# Patient Record
Sex: Female | Born: 1995 | Hispanic: Yes | Marital: Single | Smoking: Never smoker
Health system: Southern US, Community
[De-identification: ages and names within clinical notes are randomized; demographics above are authoritative.]

## PROBLEM LIST (undated history)

## (undated) DIAGNOSIS — M329 Systemic lupus erythematosus, unspecified: Secondary | ICD-10-CM

## (undated) DIAGNOSIS — F909 Attention-deficit hyperactivity disorder, unspecified type: Secondary | ICD-10-CM

## (undated) DIAGNOSIS — M069 Rheumatoid arthritis, unspecified: Secondary | ICD-10-CM

## (undated) DIAGNOSIS — IMO0002 Reserved for concepts with insufficient information to code with codable children: Secondary | ICD-10-CM

## (undated) DIAGNOSIS — F4310 Post-traumatic stress disorder, unspecified: Secondary | ICD-10-CM

## (undated) DIAGNOSIS — R519 Headache, unspecified: Secondary | ICD-10-CM

## (undated) DIAGNOSIS — E079 Disorder of thyroid, unspecified: Secondary | ICD-10-CM

## (undated) DIAGNOSIS — F32A Depression, unspecified: Secondary | ICD-10-CM

## (undated) DIAGNOSIS — E282 Polycystic ovarian syndrome: Secondary | ICD-10-CM

## (undated) DIAGNOSIS — F419 Anxiety disorder, unspecified: Secondary | ICD-10-CM

## (undated) HISTORY — DX: Polycystic ovarian syndrome: E28.2

## (undated) HISTORY — DX: Headache, unspecified: R51.9

## (undated) HISTORY — DX: Systemic lupus erythematosus, unspecified: M32.9

---

## 2018-09-25 DIAGNOSIS — F329 Major depressive disorder, single episode, unspecified: Secondary | ICD-10-CM | POA: Insufficient documentation

## 2018-09-25 DIAGNOSIS — G43009 Migraine without aura, not intractable, without status migrainosus: Secondary | ICD-10-CM | POA: Insufficient documentation

## 2018-09-25 DIAGNOSIS — R419 Unspecified symptoms and signs involving cognitive functions and awareness: Secondary | ICD-10-CM | POA: Insufficient documentation

## 2019-03-19 DIAGNOSIS — E669 Obesity, unspecified: Secondary | ICD-10-CM | POA: Insufficient documentation

## 2019-03-19 DIAGNOSIS — E559 Vitamin D deficiency, unspecified: Secondary | ICD-10-CM | POA: Insufficient documentation

## 2020-05-14 ENCOUNTER — Other Ambulatory Visit: Payer: Self-pay

## 2020-05-14 ENCOUNTER — Emergency Department (HOSPITAL_BASED_OUTPATIENT_CLINIC_OR_DEPARTMENT_OTHER)
Admission: EM | Admit: 2020-05-14 | Discharge: 2020-05-14 | Disposition: A | Discharge status: Home / Self Care | Payer: 59 | Attending: Emergency Medicine | Admitting: Emergency Medicine

## 2020-05-14 ENCOUNTER — Emergency Department (HOSPITAL_BASED_OUTPATIENT_CLINIC_OR_DEPARTMENT_OTHER): Payer: 59

## 2020-05-14 ENCOUNTER — Encounter (HOSPITAL_BASED_OUTPATIENT_CLINIC_OR_DEPARTMENT_OTHER): Payer: Self-pay | Admitting: Emergency Medicine

## 2020-05-14 DIAGNOSIS — M25511 Pain in right shoulder: Secondary | ICD-10-CM | POA: Diagnosis present

## 2020-05-14 MED ORDER — METHOCARBAMOL 500 MG PO TABS
500.0000 mg | ORAL_TABLET | Freq: Two times a day (BID) | ORAL | 0 refills | Status: DC
Start: 2020-05-14 — End: 2020-08-10

## 2020-05-14 MED ORDER — IBUPROFEN 800 MG PO TABS
800.0000 mg | ORAL_TABLET | Freq: Three times a day (TID) | ORAL | 0 refills | Status: DC | PRN
Start: 2020-05-14 — End: 2020-08-10

## 2020-05-14 NOTE — ED Provider Notes (Signed)
[facility] EMERGENCY DEPARTMENT Provider Note   CSN: [id number] Arrival date & time: 05/14/20  4098     History Chief Complaint  Patient presents with  . Shoulder Pain    Maria Sutton is a 24 y.o. female.  She has no significant past medical history.  Right-hand-dominant.  Complaining of 1 week of right shoulder pain.  Does not recall any particular trauma.  Worse with lifting her arm up away from her body or any rotation.  No numbness or weakness.  No fevers or chills.  Trying ibuprofen daily 40 mg without improvement.  Scheduled an appointment with an orthopedic doctor tomorrow.  Pain woke her up this morning.  The history is provided by the patient.  Shoulder Pain Location:  Shoulder Shoulder location:  R shoulder Injury: no   Pain details:    Quality:  Sharp   Radiates to:  Does not radiate   Severity:  Severe   Onset quality:  Gradual   Duration:  1 week   Timing:  Intermittent   Progression:  Unchanged Handedness:  Right-handed Relieved by:  Nothing Worsened by:  Movement Ineffective treatments:  NSAIDs Associated symptoms: no back pain, no fever, no muscle weakness, no neck pain, no numbness and no tingling        History reviewed. No pertinent past medical history.  There are no problems to display for this patient.   History reviewed. No pertinent surgical history.   OB History   No obstetric history on file.     No family history on file.  Social History   Tobacco Use  . Smoking status: Never Smoker  . Smokeless tobacco: Never Used  Substance Use Topics  . Alcohol use: Not Currently  . Drug use: Never    Home Medications Prior to Admission medications   Not on File    Allergies    Patient has no allergy information on record.  Review of Systems   Review of Systems  Constitutional: Negative for fever.  Respiratory: Negative for shortness of breath.   Cardiovascular: Negative for chest pain.  Musculoskeletal: Negative for  back pain and neck pain.  Skin: Negative for rash.  Neurological: Negative for weakness and numbness.    Physical Exam Updated Vital Signs BP 119/78 (BP Location: Left Arm)   Pulse 91   Temp 98.5 F (36.9 C) (Oral)   Resp 16   Ht 5\' 7"  (1.702 m)   Wt 88.5 kg   SpO2 98%   BMI 30.54 kg/m   Physical Exam Constitutional:      Appearance: She is well-developed.  HENT:     Head: Normocephalic and atraumatic.  Eyes:     Conjunctiva/sclera: Conjunctivae normal.  Musculoskeletal:        General: Tenderness present. No deformity.     Cervical back: Neck supple.     Comments: Right shoulder painful active range of motion.  Normal internal and external rotation.  Normal landmarks.  Axillary sensation intact.  Pain with palpation anterior and lateral shoulder.  Full range of motion elbow wrist.  Radial pulse 2+.  Radial ulnar median motor and sensation intact.  Skin:    General: Skin is warm and dry.     Capillary Refill: Capillary refill takes less than 2 seconds.  Neurological:     General: No focal deficit present.     Mental Status: She is alert.     GCS: GCS eye subscore is 4. GCS verbal subscore is 5. GCS motor subscore  is 6.     Sensory: No sensory deficit.     Motor: No weakness.     ED Results / Procedures / Treatments   Labs (all labs ordered are listed, but only abnormal results are displayed) Labs Reviewed - No data to display  EKG None  Radiology DG Shoulder Right  Result Date: 05/14/2020 CLINICAL DATA:  Right shoulder pain for 1 week, no reported injury EXAM: RIGHT SHOULDER - 2+ VIEW COMPARISON:  None. FINDINGS: No fracture. No glenohumeral dislocation. No evidence of acromioclavicular separation. No focal osseous lesions. No arthropathy. No radiopaque foreign bodies or pathologic soft tissue calcifications. IMPRESSION: No acute osseous abnormality. Electronically Signed   By: Delbert Phenix M.D.   On: 05/14/2020 08:31    Procedures Procedures (including critical  care time)  Medications Ordered in ED Medications - No data to display  ED Course  I have reviewed the triage vital signs and the nursing notes.  Pertinent labs & imaging results that were available during my care of the patient were reviewed by me and considered in my medical decision making (see chart for details).  Clinical Course as of May 14 1844  Wynelle Link May 14, 2020  0805 Right shoulder interpreted by me as no fracture or dislocation.   [MB]    Clinical Course User Index [MB] Terrilee Files, MD   MDM Rules/Calculators/A&P                         24 year old healthy female here with atraumatic right shoulder pain.  Sounds like she does a fairly physical job.  She has a benign physical exam other than pain with range of motion.  X-rays do not show any fracture or dislocation.  Placed in a sling for comfort.  Will increase NSAIDs and add muscle relaxant.  She has outpatient orthopedic follow-up tomorrow.  Return instructions discussed  Final Clinical Impression(s) / ED Diagnoses Final diagnoses:  Acute pain of right shoulder    Rx / DC Orders ED Discharge Orders         Ordered    ibuprofen (ADVIL) 800 MG tablet  Every 8 hours PRN     Discontinue  Reprint     05/14/20 0807    methocarbamol (ROBAXIN) 500 MG tablet  2 times daily     Discontinue  Reprint     05/14/20 0807           Terrilee Files, MD 05/14/20 [id number]

## 2020-05-14 NOTE — ED Triage Notes (Signed)
Pt reports right sided shoulder pain starting a week ago; pt reports left sided shoulder pain the week prior; took 400mg  ibuprofen at 0445 with no relief; pt reports having an appointment with the orthopedist tomorrow but the pain woke her up

## 2020-05-14 NOTE — ED Notes (Signed)
Patient returned from X-ray 

## 2020-05-14 NOTE — ED Notes (Signed)
Patient transported to X-ray 

## 2020-05-14 NOTE — Discharge Instructions (Signed)
You were seen in the emergency department for right shoulder pain.  Your x-ray did not show any fracture or dislocation.  Please use the sling for comfort.  Ice to the affected area.  We are prescribing you ibuprofen and a muscle relaxant.  Please take with food on your stomach.  Follow-up with orthopedics as scheduled tomorrow.  Return to the emergency department for any worsening or concerning symptoms.

## 2020-06-25 ENCOUNTER — Encounter (HOSPITAL_BASED_OUTPATIENT_CLINIC_OR_DEPARTMENT_OTHER): Payer: Self-pay | Admitting: Emergency Medicine

## 2020-06-25 ENCOUNTER — Emergency Department (HOSPITAL_BASED_OUTPATIENT_CLINIC_OR_DEPARTMENT_OTHER)
Admission: EM | Admit: 2020-06-25 | Discharge: 2020-06-26 | Disposition: A | Discharge status: Home / Self Care | Payer: 59 | Attending: Emergency Medicine | Admitting: Emergency Medicine

## 2020-06-25 ENCOUNTER — Other Ambulatory Visit: Payer: Self-pay

## 2020-06-25 DIAGNOSIS — E039 Hypothyroidism, unspecified: Secondary | ICD-10-CM | POA: Insufficient documentation

## 2020-06-25 DIAGNOSIS — J020 Streptococcal pharyngitis: Secondary | ICD-10-CM | POA: Diagnosis not present

## 2020-06-25 DIAGNOSIS — Z20822 Contact with and (suspected) exposure to covid-19: Secondary | ICD-10-CM | POA: Diagnosis not present

## 2020-06-25 DIAGNOSIS — R6883 Chills (without fever): Secondary | ICD-10-CM | POA: Diagnosis present

## 2020-06-25 HISTORY — DX: Post-traumatic stress disorder, unspecified: F43.10

## 2020-06-25 HISTORY — DX: Anxiety disorder, unspecified: F41.9

## 2020-06-25 HISTORY — DX: Disorder of thyroid, unspecified: E07.9

## 2020-06-25 HISTORY — DX: Depression, unspecified: F32.A

## 2020-06-25 HISTORY — DX: Attention-deficit hyperactivity disorder, unspecified type: F90.9

## 2020-06-25 LAB — GROUP A STREP BY PCR: Group A Strep by PCR: DETECTED — AB

## 2020-06-25 LAB — SARS CORONAVIRUS 2 BY RT PCR (HOSPITAL ORDER, PERFORMED IN ~~LOC~~ HOSPITAL LAB): SARS Coronavirus 2: NEGATIVE

## 2020-06-25 MED ORDER — DEXAMETHASONE 6 MG PO TABS
10.0000 mg | ORAL_TABLET | Freq: Once | ORAL | Status: AC
  Administered 2020-06-25: 10 mg via ORAL
  Filled 2020-06-25: qty 1

## 2020-06-25 MED ORDER — AMOXICILLIN 500 MG PO CAPS
500.0000 mg | ORAL_CAPSULE | Freq: Once | ORAL | Status: AC
  Administered 2020-06-25: 500 mg via ORAL
  Filled 2020-06-25: qty 1

## 2020-06-25 NOTE — ED Triage Notes (Signed)
Reports starting her period friday, states nausea, headaches, dizzy, and sore throat since then. Requesting covid test.

## 2020-06-26 MED ORDER — AMOXICILLIN 500 MG PO CAPS
500.0000 mg | ORAL_CAPSULE | Freq: Two times a day (BID) | ORAL | 0 refills | Status: DC
Start: 2020-06-26 — End: 2020-08-10

## 2020-06-26 NOTE — Discharge Instructions (Addendum)
You were seen today and found to have strep throat.  Your Covid testing was negative.  Take amoxicillin for the next 10 days.  I encourage you to seek out Covid vaccination as well given the prevalence of Covid within the community.

## 2020-06-26 NOTE — ED Provider Notes (Signed)
[facility] EMERGENCY DEPARTMENT Provider Note   CSN: [id number] Arrival date & time: 06/25/20  1905     History Chief Complaint  Patient presents with  . wants covid test    Maria Sutton is a 24 y.o. female.  HPI     This is a 24 year old female with a history of ADHD, depression, hypothyroidism who presents with sore throat, chills, body aches.  Patient reports 2 to 3-day history of the symptoms.  No known sick contacts or Covid exposures.  She is not vaccinated against COVID-19.  She states that she feels like she has had a fever at home but has not taken her temperature.  She reports pain with swallowing.  Reports pain 5 out of 10.  She has not taken anything for her symptoms.  She is requesting a COVID-19 test.  Past Medical History:  Diagnosis Date  . ADHD   . Anxiety   . Depression   . PTSD (post-traumatic stress disorder)   . Thyroid disease    hypothyroid    There are no problems to display for this patient.   History reviewed. No pertinent surgical history.   OB History   No obstetric history on file.     History reviewed. No pertinent family history.  Social History   Tobacco Use  . Smoking status: Never Smoker  . Smokeless tobacco: Never Used  Substance Use Topics  . Alcohol use: Not Currently  . Drug use: Never    Home Medications Prior to Admission medications   Medication Sig Start Date End Date Taking? Authorizing Provider  amoxicillin (AMOXIL) 500 MG capsule Take 1 capsule (500 mg total) by mouth 2 (two) times daily. 06/26/20   Nanami Whitelaw, Mayer Masker, MD  ibuprofen (ADVIL) 200 MG tablet Take 200 mg by mouth every 6 (six) hours as needed.    [provider]  ibuprofen (ADVIL) 800 MG tablet Take 1 tablet (800 mg total) by mouth every 8 (eight) hours as needed. 05/14/20   Terrilee Files, MD  methocarbamol (ROBAXIN) 500 MG tablet Take 1 tablet (500 mg total) by mouth 2 (two) times daily. 05/14/20   Terrilee Files, MD     Allergies    Patient has no known allergies.  Review of Systems   Review of Systems  Constitutional: Positive for chills. Negative for fever.  HENT: Positive for sore throat. Negative for trouble swallowing.   Respiratory: Negative for cough and shortness of breath.   Cardiovascular: Negative for chest pain.  Gastrointestinal: Negative for abdominal pain, nausea and vomiting.  All other systems reviewed and are negative.   Physical Exam Updated Vital Signs BP 125/87 (BP Location: Right Arm)   Pulse 80   Temp 98.8 F (37.1 C)   Resp 18   Wt 91.8 kg   LMP 06/23/2020 (Exact Date)   SpO2 100%   BMI 31.68 kg/m   Physical Exam Vitals and nursing note reviewed.  Constitutional:      Appearance: She is well-developed. She is obese. She is not ill-appearing.  HENT:     Head: Normocephalic and atraumatic.     Nose: Nose normal.     Mouth/Throat:     Mouth: Mucous membranes are moist.     Comments: Posterior oropharyngeal erythema, no tonsillar exudate, there is slight 1+ tonsillar enlargement bilaterally, no palatal petechiae Eyes:     Pupils: Pupils are equal, round, and reactive to light.  Cardiovascular:     Rate and Rhythm: Normal rate and  regular rhythm.     Heart sounds: Normal heart sounds.  Pulmonary:     Effort: Pulmonary effort is normal. No respiratory distress.     Breath sounds: No wheezing.  Abdominal:     Palpations: Abdomen is soft.     Tenderness: There is no abdominal tenderness.  Musculoskeletal:     Cervical back: Neck supple.  Lymphadenopathy:     Cervical: Cervical adenopathy present.  Skin:    General: Skin is warm and dry.  Neurological:     Mental Status: She is alert and oriented to person, place, and time.  Psychiatric:        Mood and Affect: Mood normal.     ED Results / Procedures / Treatments   Labs (all labs ordered are listed, but only abnormal results are displayed) Labs Reviewed  GROUP A STREP BY PCR - Abnormal; Notable  for the following components:      Result Value   Group A Strep by PCR DETECTED (*)    All other components within normal limits  SARS CORONAVIRUS 2 BY RT PCR (HOSPITAL ORDER, PERFORMED IN [facility])    EKG None  Radiology No results found.  Procedures Procedures (including critical care time)  Medications Ordered in ED Medications  dexamethasone (DECADRON) tablet 10 mg (10 mg Oral Given 06/25/20 2353)  amoxicillin (AMOXIL) capsule 500 mg (500 mg Oral Given 06/25/20 2353)    ED Course  I have reviewed the triage vital signs and the nursing notes.  Pertinent labs & imaging results that were available during my care of the patient were reviewed by me and considered in my medical decision making (see chart for details).    MDM Rules/Calculators/A&P                           Patient presents with chills, sore throat, upper respiratory symptoms.  She is overall nontoxic and vital signs are reassuring.  She is afebrile.  She has some slight erythema of the posterior oropharynx but low suspicion for deep space infection such as PTA.  Covid and strep are both considerations.  Testing reviewed from triage.  Strep test is positive.  Covid test negative.  Discussed with patient these results.  We discussed treatment and she opted for 10 days of antibiotics at home.  She was also given 10 mg of Decadron.  I also encouraged her to get her COVID-19 vaccination.  After history, exam, and medical workup I feel the patient has been appropriately medically screened and is safe for discharge home. Pertinent diagnoses were discussed with the patient. Patient was given return precautions.  Maria Sutton was evaluated in Emergency Department on 06/26/2020 for the symptoms described in the history of present illness. She was evaluated in the context of the global COVID-19 pandemic, which necessitated consideration that the patient might be at risk for infection with the SARS-CoV-2 virus that  causes COVID-19. Institutional protocols and algorithms that pertain to the evaluation of patients at risk for COVID-19 are in a state of rapid change based on information released by regulatory bodies including the CDC and federal and state organizations. These policies and algorithms were followed during the patient's care in the ED.   Final Clinical Impression(s) / ED Diagnoses Final diagnoses:  Strep pharyngitis    Rx / DC Orders ED Discharge Orders         Ordered    amoxicillin (AMOXIL) 500 MG capsule  2 times daily        06/26/20 0001           Rosalena Mccorry, Mayer Masker, MD 06/26/20 0005

## 2020-07-26 ENCOUNTER — Emergency Department (HOSPITAL_BASED_OUTPATIENT_CLINIC_OR_DEPARTMENT_OTHER): Payer: 59

## 2020-07-26 ENCOUNTER — Other Ambulatory Visit: Payer: Self-pay

## 2020-07-26 ENCOUNTER — Encounter (HOSPITAL_BASED_OUTPATIENT_CLINIC_OR_DEPARTMENT_OTHER): Payer: Self-pay

## 2020-07-26 ENCOUNTER — Emergency Department (HOSPITAL_BASED_OUTPATIENT_CLINIC_OR_DEPARTMENT_OTHER)
Admission: EM | Admit: 2020-07-26 | Discharge: 2020-07-26 | Discharge status: Against Medical Advice | Payer: 59 | Attending: Emergency Medicine | Admitting: Emergency Medicine

## 2020-07-26 DIAGNOSIS — R0602 Shortness of breath: Secondary | ICD-10-CM | POA: Diagnosis not present

## 2020-07-26 DIAGNOSIS — R079 Chest pain, unspecified: Secondary | ICD-10-CM

## 2020-07-26 DIAGNOSIS — E039 Hypothyroidism, unspecified: Secondary | ICD-10-CM | POA: Diagnosis not present

## 2020-07-26 LAB — CBC
HCT: 40.9 % (ref 36.0–46.0)
Hemoglobin: 13.7 g/dL (ref 12.0–15.0)
MCH: 27.4 pg (ref 26.0–34.0)
MCHC: 33.5 g/dL (ref 30.0–36.0)
MCV: 81.8 fL (ref 80.0–100.0)
Platelets: 355 10*3/uL (ref 150–400)
RBC: 5 MIL/uL (ref 3.87–5.11)
RDW: 12 % (ref 11.5–15.5)
WBC: 10.8 10*3/uL — ABNORMAL HIGH (ref 4.0–10.5)
nRBC: 0 % (ref 0.0–0.2)

## 2020-07-26 LAB — BASIC METABOLIC PANEL
Anion gap: 12 (ref 5–15)
BUN: 11 mg/dL (ref 6–20)
CO2: 23 mmol/L (ref 22–32)
Calcium: 8.8 mg/dL — ABNORMAL LOW (ref 8.9–10.3)
Chloride: 102 mmol/L (ref 98–111)
Creatinine, Ser: 0.65 mg/dL (ref 0.44–1.00)
GFR, Estimated: >60 mL/min (ref >60)
Glucose, Bld: 108 mg/dL — ABNORMAL HIGH (ref 70–99)
Potassium: 3.6 mmol/L (ref 3.5–5.1)
Sodium: 137 mmol/L (ref 135–145)

## 2020-07-26 LAB — TROPONIN I (HIGH SENSITIVITY): Troponin I (High Sensitivity): <2 ng/L (ref <18)

## 2020-07-26 LAB — PREGNANCY, URINE: Preg Test, Ur: NEGATIVE

## 2020-07-26 LAB — D-DIMER, QUANTITATIVE: D-Dimer, Quant: 0.55 ug/mL-FEU — ABNORMAL HIGH (ref 0.00–0.50)

## 2020-07-26 MED ORDER — IOHEXOL 350 MG/ML SOLN: 100.0000 mL | Freq: Once | INTRAVENOUS | Status: DC | PRN

## 2020-07-26 NOTE — ED Triage Notes (Signed)
Pt c/o CP started 540pm-denies fever/flu sx-NAD-steady gait

## 2020-07-26 NOTE — ED Provider Notes (Signed)
[facility] EMERGENCY DEPARTMENT Provider Note   CSN: [id number] Arrival date & time: 07/26/20  1859     History Chief Complaint  Patient presents with  . Chest Pain    Maria Sutton is a 24 y.o. female.  HPI 24 year old female with a history of ADHD, anxiety, depression, PTSD, hypothyroidism presents to the ER with complaints of left-sided chest pain and pain on inspiration which started several hours ago.  Patient states that she has a history of tendinitis and has been taking some ibuprofen.  She states that she took some ibuprofen earlier today along with some vitamin D and then started to feel some left-sided chest pain.  She also has some associated shortness of breath and pain on inspiration.  Denies any nausea or vomiting.  No prior history of PEs.  No noticeable lower extremity swelling.  She is not on any oral estrogen.  No recent surgery or travel.  She also complains of intermittent joint pains including her knees, hands and elbows.  She states she followed up with an orthopedic doctor who took an x-ray of her right shoulder and said that she had tendinitis.  She does not have a PCP.  She denies any cough, loss of taste or smell, fevers or chills.  Denies any syncope, back pain, diaphoresis.    Past Medical History:  Diagnosis Date  . ADHD   . Anxiety   . Depression   . PTSD (post-traumatic stress disorder)   . Thyroid disease    hypothyroid    There are no problems to display for this patient.   History reviewed. No pertinent surgical history.   OB History   No obstetric history on file.     No family history on file.  Social History   Tobacco Use  . Smoking status: Never Smoker  . Smokeless tobacco: Never Used  Vaping Use  . Vaping Use: Never used  Substance Use Topics  . Alcohol use: Not Currently  . Drug use: Never    Home Medications Prior to Admission medications   Medication Sig Start Date End Date Taking? Authorizing Provider    amoxicillin (AMOXIL) 500 MG capsule Take 1 capsule (500 mg total) by mouth 2 (two) times daily. 06/26/20   Horton, Mayer Masker, MD  ibuprofen (ADVIL) 200 MG tablet Take 200 mg by mouth every 6 (six) hours as needed.    [provider]  ibuprofen (ADVIL) 800 MG tablet Take 1 tablet (800 mg total) by mouth every 8 (eight) hours as needed. 05/14/20   Terrilee Files, MD  methocarbamol (ROBAXIN) 500 MG tablet Take 1 tablet (500 mg total) by mouth 2 (two) times daily. 05/14/20   Terrilee Files, MD    Allergies    Other  Review of Systems   Review of Systems  Constitutional: Negative for chills and fever.  HENT: Negative for ear pain and sore throat.   Eyes: Negative for pain and visual disturbance.  Respiratory: Positive for shortness of breath. Negative for cough.   Cardiovascular: Positive for chest pain. Negative for palpitations.  Gastrointestinal: Negative for abdominal pain and vomiting.  Genitourinary: Negative for dysuria and hematuria.  Musculoskeletal: Negative for arthralgias and back pain.  Skin: Negative for color change and rash.  Neurological: Negative for seizures and syncope.  All other systems reviewed and are negative.   Physical Exam Updated Vital Signs BP 128/81 (BP Location: Right Arm)   Pulse 82   Temp 98.4 F (36.9 C) (  Oral)   Resp (!) 22   Wt 92.1 kg   LMP 06/21/2020 Comment: neg preg test  SpO2 100%   BMI 31.79 kg/m   Physical Exam Vitals and nursing note reviewed.  Constitutional:      General: She is not in acute distress.    Appearance: She is well-developed. She is obese. She is not ill-appearing or diaphoretic.  HENT:     Head: Normocephalic and atraumatic.  Eyes:     Conjunctiva/sclera: Conjunctivae normal.  Cardiovascular:     Rate and Rhythm: Normal rate and regular rhythm.     Heart sounds: Normal heart sounds. No murmur heard.   Pulmonary:     Effort: Pulmonary effort is normal. No respiratory distress.     Breath sounds:  Normal breath sounds. No decreased breath sounds, wheezing or rhonchi.  Chest:     Chest wall: No tenderness.  Abdominal:     Palpations: Abdomen is soft.     Tenderness: There is no abdominal tenderness.  Musculoskeletal:        General: Normal range of motion.     Cervical back: Neck supple.     Right lower leg: No tenderness. No edema.     Left lower leg: No tenderness. No edema.  Skin:    General: Skin is warm and dry.     Capillary Refill: Capillary refill takes less than 2 seconds.  Neurological:     General: No focal deficit present.     Mental Status: She is alert.  Psychiatric:        Mood and Affect: Mood normal.        Behavior: Behavior normal.      ED Results / Procedures / Treatments   Labs (all labs ordered are listed, but only abnormal results are displayed) Labs Reviewed  CBC - Abnormal; Notable for the following components:      Result Value   WBC 10.8 (*)    All other components within normal limits  BASIC METABOLIC PANEL - Abnormal; Notable for the following components:   Glucose, Bld 108 (*)    Calcium 8.8 (*)    All other components within normal limits  D-DIMER, QUANTITATIVE (NOT AT [facility]) - Abnormal; Notable for the following components:   D-Dimer, Quant 0.55 (*)    All other components within normal limits  PREGNANCY, URINE  TROPONIN I (HIGH SENSITIVITY)  TROPONIN I (HIGH SENSITIVITY)    EKG None  Radiology DG Chest 2 View  Result Date: 07/26/2020 CLINICAL DATA:  Chest pain this evening. EXAM: CHEST - 2 VIEW COMPARISON:  None. FINDINGS: The heart size and mediastinal contours are normal. The lungs are clear. There is no pleural effusion or pneumothorax. No acute osseous findings are identified. Minimal convex right thoracic scoliosis may be positional. IMPRESSION: No active cardiopulmonary process. Electronically Signed   By: Carey Bullocks M.D.   On: 07/26/2020 19:26    Procedures Procedures (including critical care time)  Medications  Ordered in ED Medications  iohexol (OMNIPAQUE) 350 MG/ML injection 100 mL (has no administration in time range)    ED Course  I have reviewed the triage vital signs and the nursing notes.  Pertinent labs & imaging results that were available during my care of the patient were reviewed by me and considered in my medical decision making (see chart for details).    MDM Rules/Calculators/A&P  24 year old female with complaints of left-sided chest pain and pain on inspiration which started several hours ago.  Her vitals overall reassuring, though she is mildly tachypneic with a rate in the 20s.  No reproducible chest wall tenderness.  Abdomen is soft and nontender.  DDx includes ACS, PE, pill esophagitis, GERD.  Her CBC has a leukocytosis of 10.8, BMP with mild hypocalcemia but no significant abnormalities.  Her troponin is less than 2, however D-dimer is at 0.55.  EKG normal sinus rhythm.  Chest x-ray without acute abnormalities.  Pregnancy is negative.  Patient was a difficult stick, multiple ultrasound-guided IVs were attempted.  Patient states that she does not want to have the CT scan and wants to sign out AGAINST MEDICAL ADVICE.  She states that her chest pain and pain on inspiration has improved.  I explained to the patient the risks of this and foregoing CT scan in the setting of an elevated D-dimer.  I explained that complications could involve severe disability and death.  I welcomed the patient to return at any time for further evaluation.  She states she would like to follow-up with the PCP, I directed her to the phone number in her discharge paperwork.  She voiced understanding and is agreeable.  Hemodynamically stable at discharge.   Final Clinical Impression(s) / ED Diagnoses Final diagnoses:  Chest pain, unspecified type    Rx / DC Orders ED Discharge Orders    None       Mare Ferrari, PA-C 07/26/20 2318    Sabas Sous, MD 08/04/20 1544

## 2020-07-26 NOTE — Progress Notes (Signed)
Patient unwilling to allow IV options at this time.  Patient wants to explore other options.

## 2020-07-26 NOTE — ED Notes (Signed)
Patient requesting she followup with PCP; patient states she just doesn't want another needle stick today.

## 2020-07-26 NOTE — Discharge Instructions (Signed)
As discussed, you are leaving AGAINST MEDICAL ADVICE by not moving forward with the CT scan.  Risks of this include severe disability and death.  Please make sure to follow-up a primary care doctor, there is a phone number in your discharge paperwork which you can call in order to establish with one.  Please feel free to return to the ER at any time for further evaluation and work-up.

## 2020-08-10 ENCOUNTER — Other Ambulatory Visit: Payer: Self-pay

## 2020-08-10 ENCOUNTER — Ambulatory Visit (INDEPENDENT_AMBULATORY_CARE_PROVIDER_SITE_OTHER): Payer: 59 | Admitting: Family Medicine

## 2020-08-10 ENCOUNTER — Encounter: Payer: Self-pay | Admitting: Family Medicine

## 2020-08-10 VITALS — BP 125/84 | HR 77 | Temp 97.3°F | Ht 67.0 in | Wt 204.0 lb

## 2020-08-10 DIAGNOSIS — Z13220 Encounter for screening for lipoid disorders: Secondary | ICD-10-CM

## 2020-08-10 DIAGNOSIS — Z131 Encounter for screening for diabetes mellitus: Secondary | ICD-10-CM

## 2020-08-10 DIAGNOSIS — Z1159 Encounter for screening for other viral diseases: Secondary | ICD-10-CM

## 2020-08-10 DIAGNOSIS — E559 Vitamin D deficiency, unspecified: Secondary | ICD-10-CM

## 2020-08-10 DIAGNOSIS — Z0000 Encounter for general adult medical examination without abnormal findings: Secondary | ICD-10-CM

## 2020-08-10 DIAGNOSIS — Z6831 Body mass index (BMI) 31.0-31.9, adult: Secondary | ICD-10-CM

## 2020-08-10 DIAGNOSIS — Z114 Encounter for screening for human immunodeficiency virus [HIV]: Secondary | ICD-10-CM

## 2020-08-10 DIAGNOSIS — G43009 Migraine without aura, not intractable, without status migrainosus: Secondary | ICD-10-CM

## 2020-08-10 DIAGNOSIS — Z1329 Encounter for screening for other suspected endocrine disorder: Secondary | ICD-10-CM

## 2020-08-10 DIAGNOSIS — Z0001 Encounter for general adult medical examination with abnormal findings: Secondary | ICD-10-CM | POA: Diagnosis not present

## 2020-08-10 DIAGNOSIS — M25532 Pain in left wrist: Secondary | ICD-10-CM

## 2020-08-10 DIAGNOSIS — M25531 Pain in right wrist: Secondary | ICD-10-CM

## 2020-08-10 NOTE — Progress Notes (Signed)
11/4/202112:58 PM  Maria Sutton 07-Aug-1996, 24 y.o., female [id number]  Chief Complaint  Patient presents with  . thyroid check    and vitamin D last check 2 yrs ago - at [facility]  . joint pain and swelling    travels to feet, knees, hip, jaw  . Headache    takes cbd / topomax caused reaction    HPI:   Patient is a 24 y.o. female with past medical history significant for migraines who presents today for annual physical   Was previously in school Had issues with anxiety and migraines Issues with joints  Recent ED visits: 07/26/2020:24 year old female with complaints of left-sided chest pain and pain on inspiration which started several hours ago.  Her vitals overall reassuring, though she is mildly tachypneic with a rate in the 20s.  No reproducible chest wall tenderness.  Abdomen is soft and nontender.  DDx includes ACS, PE, pill esophagitis, GERD.  Her CBC has a leukocytosis of 10.8, BMP with mild hypocalcemia but no significant abnormalities.  Her troponin is less than 2, however D-dimer is at 0.55.  EKG normal sinus rhythm.  Chest x-ray without acute abnormalities.  Pregnancy is negative.  Patient was a difficult stick, multiple ultrasound-guided IVs were attempted.  Patient states that she does not want to have the CT scan and wants to sign out AGAINST MEDICAL ADVICE.  She states that her chest pain and pain on inspiration has improved.  I explained to the patient the risks of this and foregoing CT scan in the setting of an elevated D-dimer.  I explained that complications could involve severe disability and death.  I welcomed the patient to return at any time for further evaluation.  She states she would like to follow-up with the PCP, I directed her to the phone number in her discharge paperwork.  She voiced understanding and is agreeable.  Hemodynamically stable at discharge.  06/25/20: Patient presents with chills, sore throat, upper respiratory symptoms.  She is  overall nontoxic and vital signs are reassuring.  She is afebrile.  She has some slight erythema of the posterior oropharynx but low suspicion for deep space infection such as PTA.  Covid and strep are both considerations.  Testing reviewed from triage.  Strep test is positive.  Covid test negative.  Discussed with patient these results.  We discussed treatment and she opted for 10 days of antibiotics at home.  She was also given 10 mg of Decadron.  I also encouraged her to get her COVID-19 vaccination.  05/14/20: 24 year old healthy female here with atraumatic right shoulder pain.  Sounds like she does a fairly physical job.  She has a benign physical exam other than pain with range of motion.  X-rays do not show any fracture or dislocation.  Placed in a sling for comfort.  Will increase NSAIDs and add muscle relaxant.  She has outpatient orthopedic follow-up tomorrow.  Return instructions discussed   Migraine : she reports worsening migraine, has tried fiorocet in the past but no relief.we will start Imitrex as needed. SUMAtriptan (IMITREX) 25 MG tablet 30 tablet 0 : no longer taking this Sig: Take 1 tablet (25 mg total) by mouth every 2 (two) hours as needed for Migraine. Maximum 200 mg/day  Tried topamax but had an adverse reaction. Currently taking CBD for migraines, this is working well for her. Max is 200mg .  MRI was ordered by neurology. Insurance wouldn't pay for MRI.  Situational Anxiety Uses CBD for this. Used  to take daily now takes once per week.  Joint Pain Hands 2017, hard to write, cramps up for the rest of the day When driving cramping of forearm Tendonitis of right shoulder Ortho: did Xray Aug said she did not have arthritis Ibuprofen and muscle relaxer This worked well, and without movement improved At one point this flared up on her jaw Has noticed swelling in joints as well Unsure of family history of autoimmune diseases  She has been told previously she has  Depression,PTSD, ADHD, and triggered by needles   Needs; Pap: needs to schedule,  Dads side family history of breast cancer in 50s-60s. Covid: Declines Flu: Declines  Dentist: hasn't been Eye doctor: hasn't beeen  LMP: Oct 23rd Sexually active: yes, not trying to get pregnant Contraception: condoms Regular 30 days can fluctuate up to a week Heavy bleeding for 7 days  Depression screen [facility] 2/9 08/10/2020  Decreased Interest 0  Down, Depressed, Hopeless 0  PHQ - 2 Score 0    Fall Risk  08/10/2020  Falls in the past year? 0  Number falls in past yr: 0  Injury with Fall? 0  Follow up Falls evaluation completed     Allergies  Allergen Reactions  . Other     "alcohol"    Prior to Admission medications   Medication Sig Start Date End Date Taking? Authorizing Provider  amoxicillin (AMOXIL) 500 MG capsule Take 1 capsule (500 mg total) by mouth 2 (two) times daily. 06/26/20   Horton, Mayer Masker, MD  ibuprofen (ADVIL) 200 MG tablet Take 200 mg by mouth every 6 (six) hours as needed.    [provider]  ibuprofen (ADVIL) 800 MG tablet Take 1 tablet (800 mg total) by mouth every 8 (eight) hours as needed. 05/14/20   Terrilee Files, MD  methocarbamol (ROBAXIN) 500 MG tablet Take 1 tablet (500 mg total) by mouth 2 (two) times daily. 05/14/20   Terrilee Files, MD    Past Medical History:  Diagnosis Date  . ADHD   . Anxiety   . Depression   . Depression    [employer]reesia 08/07/2020  . Headache   . PTSD (post-traumatic stress disorder)   . Thyroid disease    hypothyroid    No past surgical history on file.  Social History   Tobacco Use  . Smoking status: Never Smoker  . Smokeless tobacco: Never Used  Substance Use Topics  . Alcohol use: Not Currently    No family history on file.  Review of Systems  Constitutional: Negative for chills, fever and malaise/fatigue.  HENT: Negative for ear pain and tinnitus.   Eyes: Negative for blurred vision and double vision.    Respiratory: Negative for cough, shortness of breath and wheezing.   Cardiovascular: Negative for chest pain, palpitations and leg swelling.  Gastrointestinal: Negative for abdominal pain, blood in stool, constipation, diarrhea, heartburn, nausea and vomiting.  Genitourinary: Negative for dysuria, frequency and hematuria.  Musculoskeletal: Positive for joint pain. Negative for back pain.  Skin: Negative for rash.  Neurological: Positive for headaches. Negative for dizziness, tingling, sensory change, focal weakness and weakness.  Psychiatric/Behavioral: Positive for depression. The patient is nervous/anxious.      OBJECTIVE:  Today's Vitals   08/10/20 1117  BP: 125/84  Pulse: 77  Temp: (!) 97.3 F (36.3 C)  SpO2: 99%  Weight: 204 lb (92.5 kg)  Height: 5\' 7"  (1.702 m)   Body mass index is 31.95 kg/m.   Physical Exam Vitals reviewed.  Constitutional:      General: She is not in acute distress.    Appearance: Normal appearance. She is not ill-appearing.  HENT:     Head: Normocephalic.     Right Ear: Tympanic membrane and ear canal normal.     Left Ear: Tympanic membrane and ear canal normal.     Nose: Nose normal.     Mouth/Throat:     Mouth: Mucous membranes are moist.     Pharynx: No oropharyngeal exudate or posterior oropharyngeal erythema.  Eyes:     Extraocular Movements: Extraocular movements intact.     Pupils: Pupils are equal, round, and reactive to light.  Cardiovascular:     Rate and Rhythm: Normal rate and regular rhythm.     Pulses: Normal pulses.     Heart sounds: Normal heart sounds. No murmur heard.  No friction rub. No gallop.   Pulmonary:     Effort: Pulmonary effort is normal. No respiratory distress.     Breath sounds: Normal breath sounds. No stridor. No wheezing, rhonchi or rales.  Abdominal:     General: Bowel sounds are normal.     Palpations: Abdomen is soft.     Tenderness: There is no abdominal tenderness.  Musculoskeletal:         General: No swelling, tenderness, deformity or signs of injury. Normal range of motion.     Cervical back: Normal range of motion.     Right lower leg: No edema.     Left lower leg: No edema.  Skin:    General: Skin is warm and dry.     Capillary Refill: Capillary refill takes less than 2 seconds.  Neurological:     General: No focal deficit present.     Mental Status: She is alert and oriented to person, place, and time.  Psychiatric:        Mood and Affect: Mood normal.        Behavior: Behavior normal.     No results found for this or any previous visit (from the past 24 hour(s)).  No results found.  ASSESSMENT and PLAN  Problem List Items Addressed This Visit   Visit Diagnoses    Migraine without aura and without status migrainosus, not intractable    -  Primary   Relevant Orders   Ambulatory referral to Neurology She is currently using CBD for this and is happy with current regimen.   Vitamin D deficiency       Relevant Orders   Vitamin D, 25-hydroxy   Lipid screening       Relevant Orders   Lipid Panel   Diabetes mellitus screening       Relevant Orders   Hemoglobin A1c   Hypocalcemia       Relevant Orders   Comprehensive metabolic panel   Screening for thyroid disorder       Relevant Orders   TSH   Annual physical exam     Will follow up with lab results Discussed the importance of eye doctor yearly and dentist q6 months. Discussed contraception options, waiting until next appointment after she has seen neurology to start this. Discussed the need for a pap, she will schedule this.    Encounter for hepatitis C screening test for low risk patient       Relevant Orders   Hepatitis C antibody   Encounter for screening for HIV       Relevant Orders   HIV Antibody (routine testing w rflx)  Body mass index (BMI) 31.0-31.9, adult       Pain of both wrist joints       Relevant Orders   ANA     Return in about 1 year (around 08/10/2021).  Macario Carls Nicolas Banh,  FNP-BC [facility] [address] [place], [place] [address] Ph.  [phone] Fax [phone]

## 2020-08-10 NOTE — Patient Instructions (Addendum)
Would recommend to schedule.  Health Maintenance, Female Adopting a healthy lifestyle and getting preventive care are important in promoting health and wellness. Ask your health care provider about:  The right schedule for you to have regular tests and exams.  Things you can do on your own to prevent diseases and keep yourself healthy. What should I know about diet, weight, and exercise? Eat a healthy diet   Eat a diet that includes plenty of vegetables, fruits, low-fat dairy products, and lean protein.  Do not eat a lot of foods that are high in solid fats, added sugars, or sodium. Maintain a healthy weight Body mass index (BMI) is used to identify weight problems. It estimates body fat based on height and weight. Your health care provider can help determine your BMI and help you achieve or maintain a healthy weight. Get regular exercise Get regular exercise. This is one of the most important things you can do for your health. Most adults should:  Exercise for at least 150 minutes each week. The exercise should increase your heart rate and make you sweat (moderate-intensity exercise).  Do strengthening exercises at least twice a week. This is in addition to the moderate-intensity exercise.  Spend less time sitting. Even light physical activity can be beneficial. Watch cholesterol and blood lipids Have your blood tested for lipids and cholesterol at 23 years of age, then have this test every 5 years. Have your cholesterol levels checked more often if:  Your lipid or cholesterol levels are high.  You are older than 23 years of age.  You are at high risk for heart disease. What should I know about cancer screening? Depending on your health history and family history, you may need to have cancer screening at various ages. This may include screening for:  Breast cancer.  Cervical cancer.  Colorectal cancer.  Skin cancer.  Lung cancer. What should I know about heart  disease, diabetes, and high blood pressure? Blood pressure and heart disease  High blood pressure causes heart disease and increases the risk of stroke. This is more likely to develop in people who have high blood pressure readings, are of African descent, or are overweight.  Have your blood pressure checked: ? Every 3-5 years if you are 65-64 years of age. ? Every year if you are 37 years old or older. Diabetes Have regular diabetes screenings. This checks your fasting blood sugar level. Have the screening done:  Once every three years after age 68 if you are at a normal weight and have a low risk for diabetes.  More often and at a younger age if you are overweight or have a high risk for diabetes. What should I know about preventing infection? Hepatitis B If you have a higher risk for hepatitis B, you should be screened for this virus. Talk with your health care provider to find out if you are at risk for hepatitis B infection. Hepatitis C Testing is recommended for:  Everyone born from [birth year] through 1964.  Anyone with known risk factors for hepatitis C. Sexually transmitted infections (STIs)  Get screened for STIs, including gonorrhea and chlamydia, if: ? You are sexually active and are younger than 23 years of age. ? You are older than 23 years of age and your health care provider tells you that you are at risk for this type of infection. ? Your sexual activity has changed since you were last screened, and you are at increased risk for chlamydia or gonorrhea.  Ask your health care provider if you are at risk.  Ask your health care provider about whether you are at high risk for HIV. Your health care provider may recommend a prescription medicine to help prevent HIV infection. If you choose to take medicine to prevent HIV, you should first get tested for HIV. You should then be tested every 3 months for as long as you are taking the medicine. Pregnancy  If you are about to stop  having your period (premenopausal) and you may become pregnant, seek counseling before you get pregnant.  Take 400 to 800 micrograms (mcg) of folic acid every day if you become pregnant.  Ask for birth control (contraception) if you want to prevent pregnancy. Osteoporosis and menopause Osteoporosis is a disease in which the bones lose minerals and strength with aging. This can result in bone fractures. If you are 10565 years old or older, or if you are at risk for osteoporosis and fractures, ask your health care provider if you should:  Be screened for bone loss.  Take a calcium or vitamin D supplement to lower your risk of fractures.  Be given hormone replacement therapy (HRT) to treat symptoms of menopause. Follow these instructions at home: Lifestyle  Do not use any products that contain nicotine or tobacco, such as cigarettes, e-cigarettes, and chewing tobacco. If you need help quitting, ask your health care provider.  Do not use street drugs.  Do not share needles.  Ask your health care provider for help if you need support or information about quitting drugs. Alcohol use  Do not drink alcohol if: ? Your health care provider tells you not to drink. ? You are pregnant, may be pregnant, or are planning to become pregnant.  If you drink alcohol: ? Limit how much you use to 0-1 drink a day. ? Limit intake if you are breastfeeding.  Be aware of how much alcohol is in your drink. In the [place] one drink equals one 12 oz bottle of beer (355 mL), one 5 oz glass of wine (148 mL), or one 1 oz glass of hard liquor (44 mL). General instructions  Schedule regular health, dental, and eye exams.  Stay current with your vaccines.  Tell your health care provider if: ? You often feel depressed. ? You have ever been abused or do not feel safe at home. Summary  Adopting a healthy lifestyle and getting preventive care are important in promoting health and wellness.  Follow your health  care provider's instructions about healthy diet, exercising, and getting tested or screened for diseases.  Follow your health care provider's instructions on monitoring your cholesterol and blood pressure. This information is not intended to replace advice given to you by your health care provider. Make sure you discuss any questions you have with your health care provider. Document Revised: 09/16/2018 Document Reviewed: 09/16/2018 Elsevier Patient Education  2020 [employer]  Pap Test Why am I having this test? A Pap test, also called a Pap smear, is a screening test to check for signs of:  Cancer of the vagina, cervix, and uterus. The cervix is the lower part of the uterus that opens into the vagina.  Infection.  Changes that may be a sign that cancer is developing (precancerous changes). Women need this test on a regular basis. In general, you should have a Pap test every 3 years until you reach menopause or age 23. Women aged 30-60 may choose to have their Pap test done at the same  time as an HPV (human papillomavirus) test every 5 years (instead of every 3 years). Your health care provider may recommend having Pap tests more or less often depending on your medical conditions and past Pap test results. What kind of sample is taken?  Your health care provider will collect a sample of cells from the surface of your cervix. This will be done using a small cotton swab, plastic spatula, or brush. This sample is often collected during a pelvic exam, when you are lying on your back on an exam table with feet in footrests (stirrups). In some cases, fluids (secretions) from the cervix or vagina may also be collected. How do I prepare for this test?  Be aware of where you are in your menstrual cycle. If you are menstruating on the day of the test, you may be asked to reschedule.  You may need to reschedule if you have a known vaginal infection on the day of the test.  Follow instructions  from your health care provider about: ? Changing or stopping your regular medicines. Some medicines can cause abnormal test results, such as digitalis and tetracycline. ? Avoiding douching or taking a bath the day before or the day of the test. Tell a health care provider about:  Any allergies you have.  All medicines you are taking, including vitamins, herbs, eye drops, creams, and over-the-counter medicines.  Any blood disorders you have.  Any surgeries you have had.  Any medical conditions you have.  Whether you are pregnant or may be pregnant. How are the results reported? Your test results will be reported as either abnormal or normal. A false-positive result can occur. A false positive is incorrect because it means that a condition is present when it is not. A false-negative result can occur. A false negative is incorrect because it means that a condition is not present when it is. What do the results mean? A normal test result means that you do not have signs of cancer of the vagina, cervix, or uterus. An abnormal result may mean that you have:  Cancer. A Pap test by itself is not enough to diagnose cancer. You will have more tests done in this case.  Precancerous changes in your vagina, cervix, or uterus.  Inflammation of the cervix.  An STD (sexually transmitted disease).  A fungal infection.  A parasite infection. Talk with your health care provider about what your results mean. Questions to ask your health care provider Ask your health care provider, or the department that is doing the test:  When will my results be ready?  How will I get my results?  What are my treatment options?  What other tests do I need?  What are my next steps? Summary  In general, women should have a Pap test every 3 years until they reach menopause or age 67.  Your health care provider will collect a sample of cells from the surface of your cervix. This will be done using a  small cotton swab, plastic spatula, or brush.  In some cases, fluids (secretions) from the cervix or vagina may also be collected. This information is not intended to replace advice given to you by your health care provider. Make sure you discuss any questions you have with your health care provider. Document Revised: 06/02/2017 Document Reviewed: 06/02/2017 Elsevier Patient Education  [employer].   If you have lab work done today you will be contacted with your lab results within the next 2 weeks.  If you have not heard from [place] then please contact us. The fastest way to get your results is to register for My Chart.   IF you received an x-ray today, you will receive an invoice from [facility]. Please contact [facility] at [phone] with questions or concerns regarding your invoice.   IF you received labwork today, you will receive an invoice from [place]. Please contact [facility] at [phone] with questions or concerns regarding your invoice.   Our billing staff will not be able to assist you with questions regarding bills from these companies.  You will be contacted with the lab results as soon as they are available. The fastest way to get your results is to activate your My Chart account. Instructions are located on the last page of this paperwork. If you have not heard from [place] regarding the results in 2 weeks, please contact this office.

## 2020-08-11 ENCOUNTER — Other Ambulatory Visit: Payer: Self-pay | Admitting: Family Medicine

## 2020-08-11 ENCOUNTER — Telehealth: Payer: Self-pay | Admitting: Family Medicine

## 2020-08-11 DIAGNOSIS — R768 Other specified abnormal immunological findings in serum: Secondary | ICD-10-CM

## 2020-08-11 LAB — COMPREHENSIVE METABOLIC PANEL
ALT: 11 IU/L (ref 0–32)
AST: 16 IU/L (ref 0–40)
Albumin/Globulin Ratio: 1.9 (ref 1.2–2.2)
Albumin: 5 g/dL (ref 3.9–5.0)
Alkaline Phosphatase: 74 IU/L (ref 44–121)
BUN/Creatinine Ratio: 8 — ABNORMAL LOW (ref 9–23)
BUN: 6 mg/dL (ref 6–20)
Bilirubin Total: 0.6 mg/dL (ref 0.0–1.2)
CO2: 23 mmol/L (ref 20–29)
Calcium: 9.8 mg/dL (ref 8.7–10.2)
Chloride: 102 mmol/L (ref 96–106)
Creatinine, Ser: 0.72 mg/dL (ref 0.57–1.00)
GFR calc Af Amer: 137 mL/min/{1.73_m2} (ref >59)
GFR calc non Af Amer: 118 mL/min/{1.73_m2} (ref >59)
Globulin, Total: 2.7 g/dL (ref 1.5–4.5)
Glucose: 81 mg/dL (ref 65–99)
Potassium: 4.3 mmol/L (ref 3.5–5.2)
Sodium: 140 mmol/L (ref 134–144)
Total Protein: 7.7 g/dL (ref 6.0–8.5)

## 2020-08-11 LAB — HEPATITIS C ANTIBODY: Hep C Virus Ab: <0.1 s/co ratio (ref 0.0–0.9)

## 2020-08-11 LAB — VITAMIN D 25 HYDROXY (VIT D DEFICIENCY, FRACTURES): Vit D, 25-Hydroxy: 26.7 ng/mL — ABNORMAL LOW (ref 30.0–100.0)

## 2020-08-11 LAB — LIPID PANEL
Chol/HDL Ratio: 4.2 ratio (ref 0.0–4.4)
Cholesterol, Total: 202 mg/dL — ABNORMAL HIGH (ref 100–199)
HDL: 48 mg/dL (ref >39)
LDL Chol Calc (NIH): 137 mg/dL — ABNORMAL HIGH (ref 0–99)
Triglycerides: 92 mg/dL (ref 0–149)
VLDL Cholesterol Cal: 17 mg/dL (ref 5–40)

## 2020-08-11 LAB — HIV ANTIBODY (ROUTINE TESTING W REFLEX): HIV Screen 4th Generation wRfx: NONREACTIVE

## 2020-08-11 LAB — HEMOGLOBIN A1C
Est. average glucose Bld gHb Est-mCnc: 103 mg/dL
Hgb A1c MFr Bld: 5.2 % (ref 4.8–5.6)

## 2020-08-11 LAB — TSH: TSH: 0.779 u[IU]/mL (ref 0.450–4.500)

## 2020-08-11 LAB — ANA: Anti Nuclear Antibody (ANA): POSITIVE — AB

## 2020-08-11 NOTE — Telephone Encounter (Signed)
What is the name of the medication? Pt is wanting plan  B  Have you contacted your pharmacy to request a refill? Yes, she was told to contact her pcp-the pharmacy is needing a script for plan B.  Which pharmacy would you like this sent to? [facility] on [address] in [place].    Patient notified that their request is being sent to the clinical staff for review and that they should receive a call once it is complete. If they do not receive a call within 72 hours they can check with their pharmacy or our office.

## 2020-08-11 NOTE — Telephone Encounter (Signed)
Plan B cannot be prescribed this an OTC med tried to call and inform pt no answer

## 2020-08-11 NOTE — Progress Notes (Signed)
Hello, If you could let Maria Sutton know her ANA titer which we did to screen her for rheumatoid arthritis did come back positive. This does not mean she has it, but that we need to do further tests in order to determine. I have placed a referral for her for Rheumatology and they should be contacting her. Thanks!

## 2020-08-14 ENCOUNTER — Encounter: Payer: Self-pay | Admitting: Family Medicine

## 2020-08-15 ENCOUNTER — Ambulatory Visit (INDEPENDENT_AMBULATORY_CARE_PROVIDER_SITE_OTHER): Payer: 59 | Admitting: Family Medicine

## 2020-08-15 ENCOUNTER — Encounter: Payer: Self-pay | Admitting: Family Medicine

## 2020-08-15 ENCOUNTER — Other Ambulatory Visit: Payer: Self-pay

## 2020-08-15 ENCOUNTER — Other Ambulatory Visit (HOSPITAL_COMMUNITY)
Admission: RE | Admit: 2020-08-15 | Discharge: 2020-08-15 | Disposition: A | Discharge status: Home / Self Care | Payer: 59 | Source: Ambulatory Visit | Attending: Family Medicine | Admitting: Family Medicine

## 2020-08-15 VITALS — BP 122/83 | HR 80 | Temp 98.0°F | Ht 67.0 in | Wt 202.0 lb

## 2020-08-15 DIAGNOSIS — Z124 Encounter for screening for malignant neoplasm of cervix: Secondary | ICD-10-CM | POA: Insufficient documentation

## 2020-08-15 DIAGNOSIS — Z113 Encounter for screening for infections with a predominantly sexual mode of transmission: Secondary | ICD-10-CM | POA: Diagnosis not present

## 2020-08-15 DIAGNOSIS — Z114 Encounter for screening for human immunodeficiency virus [HIV]: Secondary | ICD-10-CM | POA: Diagnosis not present

## 2020-08-15 NOTE — Progress Notes (Signed)
11/9/202111:39 AM  Maria Sutton 08-Feb-1996, 24 y.o., female [id number]  Chief Complaint  Patient presents with  . Gynecologic Exam    STD check, request pregnancy testing     HPI:   Patient is a 24 y.o. female with past medical history significant for migraines who presents today for annual pap.  She was having intercourse on Friday the 5th and the condom came off She took plan B on the morning of the 6th  Thyroid Discussed results Lab Results  Component Value Date   TSH 0.779 08/10/2020     Positive ANA Discussed results Lab Results  Component Value Date   ANA Positive (A) 08/10/2020  Referral placed to rheumatology Will have an appointment on Monday  Needs; Pap: today Dads side family history of breast cancer in 50s-60s. Covid: Declines Flu: Declines  Dentist: hasn't been Eye doctor: hasn't beeen  LMP: Oct 23rd Sexually active: yes, not trying to get pregnant Contraception: condoms Regular 30 days can fluctuate up to a week Heavy bleeding for 7 days  Depression screen [facility] 2/9 08/15/2020 08/10/2020  Decreased Interest 0 0  Down, Depressed, Hopeless 0 0  PHQ - 2 Score 0 0    Fall Risk  08/15/2020 08/10/2020  Falls in the past year? 0 0  Number falls in past yr: 0 0  Injury with Fall? 0 0  Follow up Falls evaluation completed Falls evaluation completed     Allergies  Allergen Reactions  . Other     "alcohol"    Prior to Admission medications   Medication Sig Start Date End Date Taking? Authorizing Provider  amoxicillin (AMOXIL) 500 MG capsule Take 1 capsule (500 mg total) by mouth 2 (two) times daily. 06/26/20   Horton, Mayer Masker, MD  ibuprofen (ADVIL) 200 MG tablet Take 200 mg by mouth every 6 (six) hours as needed.    [provider]  ibuprofen (ADVIL) 800 MG tablet Take 1 tablet (800 mg total) by mouth every 8 (eight) hours as needed. 05/14/20   Terrilee Files, MD  methocarbamol (ROBAXIN) 500 MG tablet Take 1 tablet (500 mg total)  by mouth 2 (two) times daily. 05/14/20   Terrilee Files, MD    Past Medical History:  Diagnosis Date  . ADHD   . Anxiety   . Depression   . Depression    [employer] 08/07/2020  . Headache   . PTSD (post-traumatic stress disorder)   . Thyroid disease    hypothyroid    No past surgical history on file.  Social History   Tobacco Use  . Smoking status: Never Smoker  . Smokeless tobacco: Never Used  Substance Use Topics  . Alcohol use: Not Currently    No family history on file.  Review of Systems  Constitutional: Negative for chills, fever and malaise/fatigue.  HENT: Negative for ear pain and tinnitus.   Eyes: Negative for blurred vision and double vision.  Respiratory: Negative for cough, shortness of breath and wheezing.   Cardiovascular: Negative for chest pain, palpitations and leg swelling.  Gastrointestinal: Negative for abdominal pain, blood in stool, constipation, diarrhea, heartburn, nausea and vomiting.  Genitourinary: Negative for dysuria, frequency and hematuria.  Musculoskeletal: Positive for joint pain. Negative for back pain.  Skin: Negative for rash.  Neurological: Positive for headaches. Negative for dizziness, tingling, sensory change, focal weakness and weakness.  Psychiatric/Behavioral: Positive for depression. The patient is nervous/anxious.      OBJECTIVE:  Today's Vitals   08/15/20 1103  BP: 122/83  Pulse: 80  Temp: 98 F (36.7 C)  SpO2: 99%  Weight: 202 lb (91.6 kg)  Height: 5\' 7"  (1.702 m)   Body mass index is 31.64 kg/m.   Physical Exam Vitals reviewed. Exam conducted with a chaperone present.  Constitutional:      General: She is not in acute distress.    Appearance: Normal appearance. She is not ill-appearing.  Cardiovascular:     Rate and Rhythm: Normal rate and regular rhythm.     Pulses: Normal pulses.     Heart sounds: Normal heart sounds. No murmur heard.  No friction rub. No gallop.   Pulmonary:     Effort: Pulmonary  effort is normal. No respiratory distress.     Breath sounds: Normal breath sounds. No stridor. No wheezing, rhonchi or rales.  Abdominal:     General: Bowel sounds are normal.     Palpations: Abdomen is soft.     Tenderness: There is no abdominal tenderness.  Musculoskeletal:        General: Swelling (hand joints) present. Normal range of motion.     Cervical back: Normal range of motion.  Skin:    General: Skin is warm and dry.     Capillary Refill: Capillary refill takes less than 2 seconds.  Neurological:     General: No focal deficit present.     Mental Status: She is alert and oriented to person, place, and time.  Psychiatric:        Mood and Affect: Mood normal.        Behavior: Behavior normal.     Pelvic exam: normal external genitalia, vulva, vagina, cervix, uterus and adnexa, VULVA: normal appearing vulva with no masses, tenderness or lesions, VAGINA: normal appearing vagina with normal color and discharge, no lesions, CERVIX: normal appearing cervix without discharge or lesions, UTERUS: uterus is normal size, shape, consistency and nontender, ADNEXA: normal adnexa in size, nontender and no masses, no palpable internal organs, PAP: Pap smear done today, thin-prep method, HPV test, WET MOUNT done - results: KOH done, exam chaperoned by [facility] LPN.  No results found for this or any previous visit (from the past 24 hour(s)).  No results found.  ASSESSMENT and PLAN  Problem List Items Addressed This Visit    None    Visit Diagnoses    Cervical cancer screening    -  Primary   Relevant Orders   Cytology - PAP([place])   Screening for HIV (human immunodeficiency virus)       Routine screening for STI (sexually transmitted infection)       Relevant Orders   RPR     Will follow up with lab results.  Discussed its too soon to test for pregnancy, encouraged to follow up as needed. Return in about 6 months (around 02/12/2021).  04/14/2021 Lucillie Kiesel, FNP-BC Primary Care at  [facility] [address] [place], [place] Ph.  [phone] Fax [phone]

## 2020-08-15 NOTE — Patient Instructions (Addendum)
   If you have lab work done today you will be contacted with your lab results within the next 2 weeks.  If you have not heard from us then please contact us. The fastest way to get your results is to register for My Chart.   IF you received an x-ray today, you will receive an invoice from [facility]. Please contact [facility] at [phone] with questions or concerns regarding your invoice.   IF you received labwork today, you will receive an invoice from [facility]. Please contact [facility] at [phone] with questions or concerns regarding your invoice.   Our billing staff will not be able to assist you with questions regarding bills from these companies.  You will be contacted with the lab results as soon as they are available. The fastest way to get your results is to activate your My Chart account. Instructions are located on the last page of this paperwork. If you have not heard from us regarding the results in 2 weeks, please contact this office.      Pap Test Why am I having this test? A Pap test, also called a Pap smear, is a screening test to check for signs of:  Cancer of the vagina, cervix, and uterus. The cervix is the lower part of the uterus that opens into the vagina.  Infection.  Changes that may be a sign that cancer is developing (precancerous changes). Women need this test on a regular basis. In general, you should have a Pap test every 3 years until you reach menopause or age 65. Women aged 30-60 may choose to have their Pap test done at the same time as an HPV (human papillomavirus) test every 5 years (instead of every 3 years). Your health care provider may recommend having Pap tests more or less often depending on your medical conditions and past Pap test results. What kind of sample is taken?  Your health care provider will collect a sample of cells from the surface of your cervix. This will be done using a small cotton swab, plastic  spatula, or brush. This sample is often collected during a pelvic exam, when you are lying on your back on an exam table with feet in footrests (stirrups). In some cases, fluids (secretions) from the cervix or vagina may also be collected. How do I prepare for this test?  Be aware of where you are in your menstrual cycle. If you are menstruating on the day of the test, you may be asked to reschedule.  You may need to reschedule if you have a known vaginal infection on the day of the test.  Follow instructions from your health care provider about: ? Changing or stopping your regular medicines. Some medicines can cause abnormal test results, such as digitalis and tetracycline. ? Avoiding douching or taking a bath the day before or the day of the test. Tell a health care provider about:  Any allergies you have.  All medicines you are taking, including vitamins, herbs, eye drops, creams, and over-the-counter medicines.  Any blood disorders you have.  Any surgeries you have had.  Any medical conditions you have.  Whether you are pregnant or may be pregnant. How are the results reported? Your test results will be reported as either abnormal or normal. A false-positive result can occur. A false positive is incorrect because it means that a condition is present when it is not. A false-negative result can occur. A false negative is incorrect because it means   that a condition is not present when it is. What do the results mean? A normal test result means that you do not have signs of cancer of the vagina, cervix, or uterus. An abnormal result may mean that you have:  Cancer. A Pap test by itself is not enough to diagnose cancer. You will have more tests done in this case.  Precancerous changes in your vagina, cervix, or uterus.  Inflammation of the cervix.  An STD (sexually transmitted disease).  A fungal infection.  A parasite infection. Talk with your health care provider about  what your results mean. Questions to ask your health care provider Ask your health care provider, or the department that is doing the test:  When will my results be ready?  How will I get my results?  What are my treatment options?  What other tests do I need?  What are my next steps? Summary  In general, women should have a Pap test every 3 years until they reach menopause or age 65.  Your health care provider will collect a sample of cells from the surface of your cervix. This will be done using a small cotton swab, plastic spatula, or brush.  In some cases, fluids (secretions) from the cervix or vagina may also be collected. This information is not intended to replace advice given to you by your health care provider. Make sure you discuss any questions you have with your health care provider. Document Revised: 06/02/2017 Document Reviewed: 06/02/2017 Elsevier Patient Education  2020 Else[employer] Inc.  

## 2020-08-16 LAB — SPECIMEN STATUS REPORT

## 2020-08-16 LAB — RPR: RPR Ser Ql: NONREACTIVE

## 2020-08-17 LAB — CYTOLOGY - PAP
Adequacy: ABSENT
Chlamydia: NEGATIVE
Comment: NEGATIVE
Comment: NEGATIVE
Comment: NEGATIVE
Comment: NORMAL
Diagnosis: NEGATIVE
Diagnosis: REACTIVE
High risk HPV: NEGATIVE
Neisseria Gonorrhea: NEGATIVE
Trichomonas: NEGATIVE

## 2020-08-22 ENCOUNTER — Other Ambulatory Visit: Payer: Self-pay

## 2020-08-22 ENCOUNTER — Encounter: Payer: Self-pay | Admitting: Internal Medicine

## 2020-08-22 ENCOUNTER — Ambulatory Visit: Payer: 59 | Admitting: Internal Medicine

## 2020-08-22 VITALS — BP 113/73 | HR 97 | Ht 66.5 in | Wt 203.6 lb

## 2020-08-22 DIAGNOSIS — M2550 Pain in unspecified joint: Secondary | ICD-10-CM | POA: Diagnosis not present

## 2020-08-22 DIAGNOSIS — R768 Other specified abnormal immunological findings in serum: Secondary | ICD-10-CM | POA: Insufficient documentation

## 2020-08-22 NOTE — Patient Instructions (Addendum)
I did not see clear evidence of an inflammatory disease today but with your joint pain and positive lab tests will check several tests that are more specific for lupus or rheumatoid arthritis. If these are all negative I think no more follow up with rheumatology is needed but if positive we will plan to bring you back to reassess symptoms and if needed medication.   Antinuclear Antibody Test Why am I having this test? This is a test that is used to help diagnose systemic lupus erythematosus (SLE) and other autoimmune diseases. An autoimmune disease is a disease in which the body's own defense (immune)system attacks its organs. What is being tested? This test checks for antinuclear antibodies (ANA) in the blood. The presence of ANA is associated with several autoimmune diseases. It is seen in almost all patients with lupus. What kind of sample is taken?  A blood sample is required for this test. It is usually collected by inserting a needle into a blood vessel. How are the results reported? Your test results will be reported as either positive or negative. A false-positive result can occur. A false positive is incorrect because it means that a condition is present when it is not. What do the results mean? A positive test result may mean that you have:  Lupus.  Other autoimmune diseases, such as rheumatoid arthritis, scleroderma, or Sjgren syndrome. Conditions that may cause a false-positive result include:  Liver dysfunction.  Myasthenia gravis.  Infectious mononucleosis. Talk with your health care provider about what your results mean. Questions to ask your health care provider Ask your health care provider, or the department that is doing the test:  When will my results be ready?  How will I get my results?  What are my treatment options?  What other tests do I need?  What are my next steps? Summary  This is a test that is used to help diagnose systemic lupus erythematosus  (SLE) and other autoimmune diseases. An autoimmune disease is a disease in which the body's own defense (immune)system attacks the body.  This test checks for antinuclear antibodies (ANA) in the blood. The presence of ANA is associated with several autoimmune diseases. It is seen in almost all patients with lupus.  Your test results will be reported as either positive or negative. Talk with your health care provider about what your results mean. This information is not intended to replace advice given to you by your health care provider. Make sure you discuss any questions you have with your health care provider. Document Revised: 09/05/2017 Document Reviewed: 05/22/2017 Elsevier Patient Education  2020 [employer] Inc.    Rheumatoid Factor Test Why am I having this test? The rheumatoid factor test is used to help diagnose certain autoimmune diseases. Normally, your body makes protective proteins called antibodies (IgM, IgG, and IgA) to help fight off infections. If you have an autoimmune disease, your body may make a collection of antibodies that do not function correctly (autoantibodies). They attack tissues that are wrongly identified as foreign. In some autoimmune diseases, these autoantibodies are known as the rheumatoid factor (RF). You may have this test if your health care provider suspects that you have an autoimmune disease, such as:  Rheumatoid arthritis (RA).  Systemic lupus erythematosus (SLE).  Sjgren's syndrome.  Mixed connective tissue disease. A majority of people who have rheumatoid arthritis have a positive rheumatoid factor. Raised levels of these autoantibodies can also sometimes be a sign of other autoimmune diseases. However, it is also  possible for the RF test to be negative even when a disease is present. Likewise, a small number of people may have a positive RF test when an autoimmune disease is not actually present. Other tests may be needed to help make a  diagnosis. What is being tested? This test checks your blood for the RF autoantibodies. What kind of sample is taken?  A blood sample is required for this test. It is usually collected by inserting a needle into a blood vessel or by sticking a finger with a small needle. How are the results reported? Your test result will be reported as either positive or negative for RF autoantibodies. What do the results mean? A negative result means that no RF or only a small amount was found in your blood. This means that it is unlikely that you have an autoimmune disease. A positive result means that a larger amount of RF autoantibodies was found in your blood. This may indicate that you have RA or another autoimmune disease. Your health care provider will talk to you about doing more tests to confirm your results. Talk with your health care provider about what your results mean. Questions to ask your health care provider Ask your health care provider, or the department that is doing the test:  When will my results be ready?  How will I get my results?  What are my treatment options?  What other tests do I need?  What are my next steps? Summary  The rheumatoid factor test is used to help diagnose certain autoimmune diseases.  In some autoimmune diseases, the body makes autoantibodies known as the rheumatoid factor (RF), which attack tissues that are wrongly identified as foreign.  A negative result means that no RF or only a small amount was found in your blood.  A positive result means that a larger amount of RF autoantibodies was found in your blood.  Talk with your health care provider about what your results mean. This information is not intended to replace advice given to you by your health care provider. Make sure you discuss any questions you have with your health care provider. Document Revised: 09/05/2017 Document Reviewed: 07/02/2017 Elsevier Patient Education  2020 [employer].

## 2020-08-22 NOTE — Progress Notes (Signed)
Office Visit Note  Patient: Maria Sutton             Date of Birth: 12/03/1995           MRN: [id number]             PCP: Just, Azalee Course, FNP Referring: Just, Azalee Course, FNP Visit Date: 08/22/2020  Subjective:  Pain and Edema of the Right Hand, Pain and Edema of the Left Hand, New Patient (Initial Visit), Abnormal Lab, and Joint Pain   History of Present Illness: Maria Sutton is a 24 y.o. female here for evaluation of positive ANA associated with joint pain involving hands, shoulders, and knees that is ongoing for at least 2 months. She originally had shoulder pain that was severe enough to seek evaluation at the ED where it was felt to be caused by rotator cuff disease after xrays did not show arthritis. This improved after avoiding heavy lifting but afterwards she resumed more normal activity and had worsening pain in both hands as well. She noticed swelling over the right 4th MCP and the left 2nd MCP intermittently and had trouble fully closing her grip with this finger. She did not notice significant joint swelling elsewhere. She describes some hair thinning from years ago attributed to hypothyroidism but no recent change, no lymphadenopathy, no fever, no oral ulcers, no skin rash, no pleurisy. She has a family history of thyroid disease but no other known autoimmune diseases. She has had numerous minor joint injuries in the past but generally avoids medical evaluation for these. During her joint pain in the past few months she has had xrays of her hands and shoulders reported as not showing arthritis.   Labs reviewed 08/2020 ANA positive Vitamin D 26.7 TSH 0.779 Hgb A1c 5.2% HIV negative Hep C negative RPR negative CBC normal CMP normal   Activities of Daily Living:  Patient reports morning stiffness for 3-4 hours.   Patient Denies nocturnal pain.  Difficulty dressing/grooming: Denies Difficulty climbing stairs: Reports Difficulty getting out of chair: Reports Difficulty  using hands for taps, buttons, cutlery, and/or writing: Reports  Review of Systems  Constitutional: Positive for fatigue.  HENT: Negative for mouth sores, mouth dryness and nose dryness.   Eyes: Negative for pain, itching, visual disturbance and dryness.  Respiratory: Negative for cough, hemoptysis, shortness of breath and difficulty breathing.   Cardiovascular: Negative for chest pain, palpitations and swelling in legs/feet.  Gastrointestinal: Negative for abdominal pain, blood in stool, constipation and diarrhea.  Endocrine: Negative for increased urination.  Genitourinary: Negative for painful urination.  Musculoskeletal: Positive for arthralgias, joint pain, joint swelling and morning stiffness. Negative for myalgias, muscle weakness, muscle tenderness and myalgias.  Skin: Negative for color change, rash and redness.  Allergic/Immunologic: Negative for susceptible to infections.  Neurological: Positive for weakness. Negative for dizziness, numbness, headaches and memory loss.  Hematological: Negative for swollen glands.  Psychiatric/Behavioral: Negative for confusion and sleep disturbance.    PMFS History:  Patient Active Problem List   Diagnosis Date Noted  . Positive ANA (antinuclear antibody) 08/22/2020  . Polyarthralgia 08/22/2020  . Vitamin D deficiency 03/19/2019  . Obesity with body mass index (BMI) of 30.0 to 39.9 03/19/2019  . Reactive depression 09/25/2018  . Migraine without aura and without status migrainosus, not intractable 09/25/2018  . Cognitive complaints 09/25/2018    Past Medical History:  Diagnosis Date  . ADHD   . Anxiety   . Depression   . Depression    Phreesia 08/07/2020  .  Headache   . PTSD (post-traumatic stress disorder)   . Thyroid disease    hypothyroid    Family History  Problem Relation Age of Onset  . Hypertension Mother   . Cancer Father    History reviewed. No pertinent surgical history. Social History   Social History Narrative    . Not on file    There is no immunization history on file for this patient.   Objective: Vital Signs: BP 113/73 (BP Location: Left Arm, Patient Position: Sitting, Cuff Size: Small)   Pulse 97   Ht 5' 6.5" (1.689 m)   Wt 203 lb 9.6 oz (92.4 kg)   LMP 07/29/2020   BMI 32.37 kg/m    Physical Exam HENT:     Right Ear: External ear normal.     Left Ear: External ear normal.     Mouth/Throat:     Mouth: Mucous membranes are moist.     Pharynx: Oropharynx is clear.  Eyes:     Conjunctiva/sclera: Conjunctivae normal.  Cardiovascular:     Rate and Rhythm: Normal rate and regular rhythm.  Pulmonary:     Effort: Pulmonary effort is normal.     Breath sounds: Normal breath sounds.  Skin:    General: Skin is warm and dry.     Findings: No rash.  Neurological:     General: No focal deficit present.     Mental Status: She is alert.     Musculoskeletal Exam:  Neck full range of motion no tenderness Shoulder, elbow, wrist full range of motion no tenderness or swelling Fingers show reversible hyperextension of PIP joints and flexion of DIP joints, small soft tissue thickening over right 2-3rd MCP joints Paraspinal tenderness over left lower back Normal hip internal and external rotation without pain, no tenderness to lateral hip palpation Knees, ankles full range of motion no tenderness or swelling Left 1st MTP ROM reduced no tenderness or swelling   CDAI Exam: CDAI Score: -- Patient Global: --; Provider Global: -- Swollen: --; Tender: -- Joint Exam 08/22/2020   No joint exam has been documented for this visit   There is currently no information documented on the homunculus. Go to the Rheumatology activity and complete the homunculus joint exam.  Investigation: No additional findings.  Imaging: DG Chest 2 View  Result Date: 07/26/2020 CLINICAL DATA:  Chest pain this evening. EXAM: CHEST - 2 VIEW COMPARISON:  None. FINDINGS: The heart size and mediastinal contours are  normal. The lungs are clear. There is no pleural effusion or pneumothorax. No acute osseous findings are identified. Minimal convex right thoracic scoliosis may be positional. IMPRESSION: No active cardiopulmonary process. Electronically Signed   By: Carey Bullocks M.D.   On: 07/26/2020 19:26    Recent Labs: Lab Results  Component Value Date   WBC 10.8 (H) 07/26/2020   HGB 13.7 07/26/2020   PLT 355 07/26/2020   NA 140 08/10/2020   K 4.3 08/10/2020   CL 102 08/10/2020   CO2 23 08/10/2020   GLUCOSE 81 08/10/2020   BUN 6 08/10/2020   CREATININE 0.72 08/10/2020   BILITOT 0.6 08/10/2020   ALKPHOS 74 08/10/2020   AST 16 08/10/2020   ALT 11 08/10/2020   PROT 7.7 08/10/2020   ALBUMIN 5.0 08/10/2020   CALCIUM 9.8 08/10/2020   GFRAA 137 08/10/2020    Speciality Comments: No specialty comments available.  Procedures:  No procedures performed Allergies: Other   Assessment / Plan:     Visit Diagnoses: Positive ANA (  antinuclear antibody) - Plan: ANA, RNP Antibody, Anti-Smith antibody, Sjogrens syndrome-A extractable nuclear antibody, Sjogrens syndrome-B extractable nuclear antibody, Anti-DNA antibody, double-stranded, C3 and C4, Protein / creatinine ratio, urine  She has positive ANA unknown titer or pattern with polyarthralgia but no obvious inflammatory joint changes on exam today. She denies other inflammatory systemic symptoms. There are reversible swan neck changes of her fingers but these are reported as not new. We will check ENA serologies for lupus also complements and proteinuria. If negative would rule out lupus at this time.  Polyarthralgia - Plan: Rheumatoid factor, Cyclic citrul peptide antibody, IgG  She describes episodic finger swelling as well as 3-4 hours morning stiffness and bilateral polyarticular joint pain so will check antibodies for RA. Previous xrays reported as normal does not exclude disease especially if symptoms are only a few months old.  Orders: Orders  Placed This Encounter  Procedures  . ANA  . RNP Antibody  . Anti-Smith antibody  . Sjogrens syndrome-A extractable nuclear antibody  . Sjogrens syndrome-B extractable nuclear antibody  . Anti-DNA antibody, double-stranded  . C3 and C4  . Protein / creatinine ratio, urine  . Rheumatoid factor  . Cyclic citrul peptide antibody, IgG   No orders of the defined types were placed in this encounter.  Follow-Up Instructions: No follow-ups on file.   Fuller Plan, MD  Note - This record has been created using [employer].  Chart creation errors have been sought, but may not always  have been located. Such creation errors do not reflect on  the standard of medical care.

## 2020-08-24 ENCOUNTER — Telehealth: Payer: Self-pay

## 2020-08-24 LAB — ANTI-SMITH ANTIBODY: ENA SM Ab Ser-aCnc: <1 AI

## 2020-08-24 LAB — SJOGRENS SYNDROME-B EXTRACTABLE NUCLEAR ANTIBODY: SSB (La) (ENA) Antibody, IgG: <1 AI

## 2020-08-24 LAB — RHEUMATOID FACTOR: Rheumatoid fact SerPl-aCnc: 42 IU/mL — ABNORMAL HIGH (ref <14)

## 2020-08-24 LAB — PROTEIN / CREATININE RATIO, URINE
Creatinine, Urine: 253 mg/dL (ref 20–275)
Protein/Creat Ratio: 43 mg/g creat (ref 21–161)
Protein/Creatinine Ratio: 0.043 mg/mg creat (ref 0.021–0.16)
Total Protein, Urine: 11 mg/dL (ref 5–24)

## 2020-08-24 LAB — CYCLIC CITRUL PEPTIDE ANTIBODY, IGG: Cyclic Citrullin Peptide Ab: >250 UNITS — ABNORMAL HIGH

## 2020-08-24 LAB — ANTI-NUCLEAR AB-TITER (ANA TITER): ANA Titer 1: 1:320 {titer} — ABNORMAL HIGH

## 2020-08-24 LAB — C3 AND C4
C3 Complement: 149 mg/dL (ref 83–193)
C4 Complement: 33 mg/dL (ref 15–57)

## 2020-08-24 LAB — SJOGRENS SYNDROME-A EXTRACTABLE NUCLEAR ANTIBODY: SSA (Ro) (ENA) Antibody, IgG: >8 AI — AB

## 2020-08-24 LAB — ANA: Anti Nuclear Antibody (ANA): POSITIVE — AB

## 2020-08-24 LAB — RNP ANTIBODY: Ribonucleic Protein(ENA) Antibody, IgG: <1 AI

## 2020-08-24 LAB — ANTI-DNA ANTIBODY, DOUBLE-STRANDED: ds DNA Ab: 3 IU/mL

## 2020-08-24 NOTE — Telephone Encounter (Signed)
Patient called requesting a return call to discuss her labwork results.  Patient states she reviewed them on her mychart and has a couple of questions.

## 2020-08-25 NOTE — Progress Notes (Signed)
Lab tests are highly positive with CCP > 250 also SSA is > 8 with ANA 1:320. This is highly suggestive for rheumatoid arthritis and possibly for an overlap but does not have a lot of systemic complaints besides pain to indicate lupus at this time. I spoke with her about this and recommend we do plan to follow up and discuss treatment options. We will plan to see her back in next 1-2 weeks.

## 2020-08-25 NOTE — Telephone Encounter (Signed)
I spoke with Maria Sutton about her test results they were highly positive and she reports symptoms increasing again so I definitely recommend she follow up. Please call her for scheduling a f/u appointment sometime within the next 1 or 2 weeks.

## 2020-08-27 NOTE — Progress Notes (Signed)
Office Visit Note  Patient: Maria Sutton             Date of Birth: 1996-06-09           MRN: [id number]             PCP: Just, Azalee Course, FNP Referring: Just, Azalee Course, FNP Visit Date: 08/28/2020  Subjective:  History of Present Illness: Luverna Degenhart is a 24 y.o. female Here for follow up with joint pain of multiple sites including the shoulders, hands, and knees bilaterally with positive ANA workup at that time demonstrated highly positive CCP Ab, SSA, and ANA IFA 1:320 nuclear, speckled. Since the last visit she suffered another episode of sharp pain this time at the right shoulder. This was worsened on deep inspiration and with lying supine. Her arm pain from the same time partially improved with 800 mg ibuprofen but the chest pain did not. She also had some right arm pain related to overuse walking her bulldog. She is feeling well today but does still have stiffness and fatigue.   Labs reviewed 08/2020 ANA 1:320 nuclear, speckled RF 42 CCP >250 C3 C4 normal Urine P/C ratio normal SSA >8.0 Smith negative RNP negative dsDNA negative SSB negative  08/2020 ANA positive Vitamin D 26.7  Activities of Daily Living:  Patient reports morning stiffness for 2-3 hours.   Patient Reports nocturnal pain.  Difficulty dressing/grooming: Denies Difficulty climbing stairs: Denies Difficulty getting out of chair: Denies Difficulty using hands for taps, buttons, cutlery, and/or writing: Reports  Review of Systems  Constitutional: Positive for fatigue.  HENT: Negative for mouth sores, mouth dryness and nose dryness.   Eyes: Negative for pain, itching, visual disturbance and dryness.  Respiratory: Negative for cough, hemoptysis, shortness of breath and difficulty breathing.   Cardiovascular: Negative for chest pain, palpitations and swelling in legs/feet.  Gastrointestinal: Negative for abdominal pain, blood in stool, constipation and diarrhea.  Endocrine: Negative for increased  urination.  Genitourinary: Negative for painful urination.  Musculoskeletal: Positive for arthralgias, joint pain, joint swelling, myalgias, muscle weakness, morning stiffness, muscle tenderness and myalgias.  Skin: Negative for color change, rash and redness.  Allergic/Immunologic: Negative for susceptible to infections.  Neurological: Negative for dizziness, numbness, headaches, memory loss and weakness.  Hematological: Negative for swollen glands.  Psychiatric/Behavioral: Negative for confusion and sleep disturbance.    PMFS History:  Patient Active Problem List   Diagnosis Date Noted  . Rheumatoid arthritis with rheumatoid factor of multiple sites without organ or systems involvement (HCC) 08/28/2020  . High risk medication use 08/28/2020  . Positive ANA (antinuclear antibody) 08/22/2020  . Polyarthralgia 08/22/2020  . Vitamin D deficiency 03/19/2019  . Obesity with body mass index (BMI) of 30.0 to 39.9 03/19/2019  . Reactive depression 09/25/2018  . Migraine without aura and without status migrainosus, not intractable 09/25/2018  . Cognitive complaints 09/25/2018    Past Medical History:  Diagnosis Date  . ADHD   . Anxiety   . Depression   . Depression    Phreesia 08/07/2020  . Headache   . PTSD (post-traumatic stress disorder)   . Thyroid disease    hypothyroid    Family History  Problem Relation Age of Onset  . Hypertension Mother   . Cancer Father    History reviewed. No pertinent surgical history. Social History   Social History Narrative  . Not on file    There is no immunization history on file for this patient.   Objective: Vital Signs: BP  111/76 (BP Location: Right Arm, Patient Position: Sitting, Cuff Size: Small)   Pulse 81   Ht 5' 6.5" (1.689 m)   Wt 204 lb (92.5 kg)   LMP 07/29/2020   BMI 32.43 kg/m    Physical Exam Skin:    General: Skin is warm and dry.     Findings: No rash.  Neurological:     General: No focal deficit present.    Psychiatric:        Mood and Affect: Mood normal.     Musculoskeletal Exam:  Shoulder, elbow, wrist full range of motion no tenderness or swelling Fingers show increased ROM with PIP hyperextension and DIP flexion when extended fully No hand swelling present No paraspinal tenderness to palpation over upper back Knees full range of motion no tenderness or swelling   Investigation: No additional findings.  Imaging: No results found.  Recent Labs: Lab Results  Component Value Date   WBC 10.8 (H) 07/26/2020   HGB 13.7 07/26/2020   PLT 355 07/26/2020   NA 140 08/10/2020   K 4.3 08/10/2020   CL 102 08/10/2020   CO2 23 08/10/2020   GLUCOSE 81 08/10/2020   BUN 6 08/10/2020   CREATININE 0.72 08/10/2020   BILITOT 0.6 08/10/2020   ALKPHOS 74 08/10/2020   AST 16 08/10/2020   ALT 11 08/10/2020   PROT 7.7 08/10/2020   ALBUMIN 5.0 08/10/2020   CALCIUM 9.8 08/10/2020   GFRAA 137 08/10/2020    Speciality Comments: No specialty comments available.  Procedures:  No procedures performed Allergies: Other   Assessment / Plan:     Visit Diagnoses: Reumatoid arthritis with rheumatoid factor of multiple sites without organ or systems involved (HCC) High risk medication use  She has inflammatory sounding joint pain of the hands especially on right side with prolonged morning stiffness and episodes of swelling. High positive CCP and positive RF consistent with this. We discussed treatment options she has a lot of anxiety about medication side effects so talked about hydroxychloroquine and methotrexate as options she agrees with starting HCQ at this time. Discussed risks including GI upset, hyperpigmentation, allergic reaction, arrhythmia, and retinal toxicity with need for eye examination soon after starting and annual monitoring.  Positive ANA (antinuclear antibody) - Plan: hydroxychloroquine (PLAQUENIL) 200 MG tablet  She has positive ANA with high SSA Ab titer. This can be seen in  association with RA although also with her chest pain episodes question if she has an overlap with other autoimmune process. She has no glandular complaints suggesting sjogren's syndrome.  Orders: No orders of the defined types were placed in this encounter.  Meds ordered this encounter  Medications  . hydroxychloroquine (PLAQUENIL) 200 MG tablet    Sig: Take 2 tablets (400 mg total) by mouth daily.    Dispense:  120 tablet    Refill:  0    Follow-Up Instructions: Return in about 2 months (around 10/28/2020).   Fuller Plan, MD  Note - This record has been created using [employer].  Chart creation errors have been sought, but may not always  have been located. Such creation errors do not reflect on  the standard of medical care.

## 2020-08-28 ENCOUNTER — Ambulatory Visit: Payer: 59 | Admitting: Internal Medicine

## 2020-08-28 ENCOUNTER — Encounter: Payer: Self-pay | Admitting: Internal Medicine

## 2020-08-28 ENCOUNTER — Other Ambulatory Visit: Payer: Self-pay

## 2020-08-28 VITALS — BP 111/76 | HR 81 | Ht 66.5 in | Wt 204.0 lb

## 2020-08-28 DIAGNOSIS — M0579 Rheumatoid arthritis with rheumatoid factor of multiple sites without organ or systems involvement: Secondary | ICD-10-CM | POA: Diagnosis not present

## 2020-08-28 DIAGNOSIS — R768 Other specified abnormal immunological findings in serum: Secondary | ICD-10-CM

## 2020-08-28 DIAGNOSIS — Z79899 Other long term (current) drug therapy: Secondary | ICD-10-CM

## 2020-08-28 MED ORDER — HYDROXYCHLOROQUINE SULFATE 200 MG PO TABS: 400.0000 mg | ORAL_TABLET | Freq: Every day | ORAL | 0 refills | Status: DC

## 2020-08-28 NOTE — Patient Instructions (Signed)
Hydroxychloroquine tablets What is this medicine? HYDROXYCHLOROQUINE (hye drox ee KLOR oh kwin) is used to treat rheumatoid arthritis and systemic lupus erythematosus. It is also used to treat malaria. This medicine may be used for other purposes; ask your health care provider or pharmacist if you have questions. COMMON BRAND NAME(S): Plaquenil, Quineprox What should I tell my health care provider before I take this medicine? They need to know if you have any of these conditions:  diabetes  eye disease, vision problems  G6PD deficiency  heart disease  history of irregular heartbeat  if you often drink alcohol  kidney disease  liver disease  porphyria  psoriasis  an unusual or allergic reaction to chloroquine, hydroxychloroquine, other medicines, foods, dyes, or preservatives  pregnant or trying to get pregnant  breast-feeding How should I use this medicine? Take this medicine by mouth with a glass of water. Follow the directions on the prescription label. Do not cut, crush or chew this medicine. Swallow the tablets whole. Take this medicine with food. Avoid taking antacids within 4 hours of taking this medicine. It is best to separate these medicines by at least 4 hours. Take your medicine at regular intervals. Do not take it more often than directed. Take all of your medicine as directed even if you think you are better. Do not skip doses or stop your medicine early. Talk to your pediatrician regarding the use of this medicine in children. While this drug may be prescribed for selected conditions, precautions do apply. Overdosage: If you think you have taken too much of this medicine contact a poison control center or emergency room at once. NOTE: This medicine is only for you. Do not share this medicine with others. What if I miss a dose? If you miss a dose, take it as soon as you can. If it is almost time for your next dose, take only that dose. Do not take double or extra  doses. What may interact with this medicine? Do not take this medicine with any of the following medications:  cisapride  dronedarone  pimozide  thioridazine This medicine may also interact with the following medications:  ampicillin  antacids  cimetidine  cyclosporine  digoxin  kaolin  medicines for diabetes, like insulin, glipizide, glyburide  medicines for seizures like carbamazepine, phenobarbital, phenytoin  mefloquine  methotrexate  other medicines that prolong the QT interval (cause an abnormal heart rhythm)  praziquantel This list may not describe all possible interactions. Give your health care provider a list of all the medicines, herbs, non-prescription drugs, or dietary supplements you use. Also tell them if you smoke, drink alcohol, or use illegal drugs. Some items may interact with your medicine. What should I watch for while using this medicine? Visit your health care professional for regular checks on your progress. Tell your health care professional if your symptoms do not start to get better or if they get worse. You may need blood work done while you are taking this medicine. If you take other medicines that can affect heart rhythm, you may need more testing. Talk to your health care professional if you have questions. Your vision may be tested before and during use of this medicine. Tell your health care professional right away if you have any change in your eyesight. What side effects may I notice from receiving this medicine? Side effects that you should report to your doctor or health care professional as soon as possible:  allergic reactions like skin rash, itching or hives,   swelling of the face, lips, or tongue  changes in vision  decreased hearing or ringing of the ears  muscle weakness  redness, blistering, peeling or loosening of the skin, including inside the mouth  sensitivity to light  signs and symptoms of a dangerous change in  heartbeat or heart rhythm like chest pain; dizziness; fast or irregular heartbeat; palpitations; feeling faint or lightheaded, falls; breathing problems  signs and symptoms of liver injury like dark yellow or brown urine; general ill feeling or flu-like symptoms; light-colored stools; loss of appetite; nausea; right upper belly pain; unusually weak or tired; yellowing of the eyes or skin  signs and symptoms of low blood sugar such as feeling anxious; confusion; dizziness; increased hunger; unusually weak or tired; sweating; shakiness; cold; irritable; headache; blurred vision; fast heartbeat; loss of consciousness  suicidal thoughts  uncontrollable head, mouth, neck, arm, or leg movements Side effects that usually do not require medical attention (report to your doctor or health care professional if they continue or are bothersome):  diarrhea  dizziness  hair loss  headache  irritable  loss of appetite  nausea, vomiting  stomach pain This list may not describe all possible side effects. Call your doctor for medical advice about side effects. You may report side effects to FDA at 1-800-FDA-1088. Where should I keep my medicine? Keep out of the reach of children. Store at room temperature between 15 and 30 degrees C (59 and 86 degrees F). Protect from moisture and light. Throw away any unused medicine after the expiration date. NOTE: This sheet is a summary. It may not cover all possible information. If you have questions about this medicine, talk to your doctor, pharmacist, or health care provider.  2020 Elsevier/Gold Standard (2019-02-01 12:56:32)  

## 2020-08-29 ENCOUNTER — Telehealth: Payer: Self-pay

## 2020-08-29 NOTE — Telephone Encounter (Signed)
Patient called stating she took Plaquenil (2 tablets) yesterday at 5:00 pm and 4 hours later began having diarrhea.  Patient states she has been okay this morning, but is not sure if she should take the pills this morning and see if she has diarrhea again or wait til 5:00 pm tonight.

## 2020-08-29 NOTE — Telephone Encounter (Signed)
I recommend she try decreasing the medication to 1 tablet daily due to diarrhea since this may be better tolerated. This is a side effect which commonly improves on repeat doses so starting at the lower dose is a reasonable option. However, if it continues even at 1 tablet daily she should call back again.

## 2020-08-29 NOTE — Telephone Encounter (Signed)
Spoke with patient, advised per Dr. Dimple Casey, she try decreasing the medication to 1 tablet daily due to diarrhea since this may be better tolerated. This is a side effect which commonly improves on repeat doses so starting at the lower dose is a reasonable option. However, if it continues even at 1 tablet daily she should call back again.

## 2020-09-18 ENCOUNTER — Telehealth: Payer: Self-pay

## 2020-09-18 NOTE — Telephone Encounter (Signed)
Spoke with Maria Sutton she is noticing new petechial rash on the face around her eyes since yesterday. She also notices loss of appetite not having any desire to eat. She does recall vomiting the preceding night she suspects related to food poisoning exposure. These symptoms sound very atypical for HCQ related adverse events but recommended she can try holding off the medication till Friday and we will check back whether there is any improvement.

## 2020-09-18 NOTE — Telephone Encounter (Signed)
Patient began taking Hydroxychloroquine on 08/28/2020 and developed red spots around her eyes yesterday, not painful or itchy but consistently present since developing yesterday.   Patient denies any other side effects. Please advise.

## 2020-09-18 NOTE — Telephone Encounter (Signed)
Patient called stating since starting the Hydroxychloroquine she began developing red spots all around both eyes.  Patient states they are not raised and do not itch or burn. Patient states the spots go all the way up to her eye brown.  Patient is requesting a return call.

## 2020-09-22 NOTE — Telephone Encounter (Signed)
Attempted to contact patient to check in on how her side effects are since holding off on HCQ, mailbox is full, unable to leave a message.

## 2020-09-22 NOTE — Telephone Encounter (Signed)
Trismus/difficulty moving the jaw is a very uncommon side effect of Plaquenil it has been reported although I have not seen this personally. I would recommend stopping it completely for now and see if this resolves much like she did decreasing the dose by half for the past 4 days.

## 2020-09-22 NOTE — Telephone Encounter (Signed)
Spoke with patient, she has previously had issues with jaw pain prior to ever starting the medication. Per patient, the jaw pain was resolved with the full dose of PLQ, but has returned since cutting the dose in half. Given this information should she continue to hold the medication? Please advise.

## 2020-09-22 NOTE — Telephone Encounter (Signed)
Spoke with patient, she continued taking the medication, 100 mg per day per Dr. Gregary Cromer instructions. Patient has gotten her appetite back, but now complains of waking up with a pain in her jaw. When patient takes the medication the jaw pain resolves, but then returns the next morning. She describes the pain as difficulty and pain with opening and closing her mouth. The red spots around patients eyes has resolved. Patient has had no episodes of vomiting.   Please advise on dosage moving forward. Thanks!

## 2020-09-25 ENCOUNTER — Encounter: Payer: Self-pay | Admitting: Radiology

## 2020-09-25 NOTE — Telephone Encounter (Signed)
I think she can probably resume the plaquenil 200mg  once daily safely. If she notices a return of her original complaint of red skin changes around the eyes or loss of appetite again with starting this we will probably try a different medication.

## 2020-09-25 NOTE — Telephone Encounter (Signed)
Attempted to contact patient, mailbox is full unable to leave voicemail

## 2020-09-26 NOTE — Telephone Encounter (Signed)
I called patient, patient verbalized understanding. 

## 2020-10-23 ENCOUNTER — Ambulatory Visit: Payer: 59 | Admitting: Internal Medicine

## 2020-10-24 ENCOUNTER — Ambulatory Visit: Payer: 59 | Admitting: Neurology

## 2020-10-25 ENCOUNTER — Encounter: Payer: Self-pay | Admitting: Internal Medicine

## 2020-10-25 ENCOUNTER — Other Ambulatory Visit: Payer: Self-pay

## 2020-10-25 ENCOUNTER — Ambulatory Visit (INDEPENDENT_AMBULATORY_CARE_PROVIDER_SITE_OTHER): Payer: 59 | Admitting: Internal Medicine

## 2020-10-25 VITALS — BP 124/85 | HR 85 | Ht 66.5 in | Wt 207.4 lb

## 2020-10-25 DIAGNOSIS — Z79899 Other long term (current) drug therapy: Secondary | ICD-10-CM

## 2020-10-25 DIAGNOSIS — R768 Other specified abnormal immunological findings in serum: Secondary | ICD-10-CM

## 2020-10-25 DIAGNOSIS — M0579 Rheumatoid arthritis with rheumatoid factor of multiple sites without organ or systems involvement: Secondary | ICD-10-CM | POA: Diagnosis not present

## 2020-10-25 MED ORDER — HYDROXYCHLOROQUINE SULFATE 200 MG PO TABS: 200.0000 mg | ORAL_TABLET | Freq: Every day | ORAL | 1 refills | Status: AC

## 2020-10-25 NOTE — Progress Notes (Signed)
Office Visit Note  Patient: Maria Sutton             Date of Birth: 10/01/1996           MRN: [id number]             PCP: Just, Azalee Course, FNP Referring: Just, Azalee Course, FNP Visit Date: 10/25/2020   Subjective:  Follow-up (Patient has reduced HCQ dose from 400 mg daily to 200 mg daily. Previously patient was having side effects of medication which have since cleared up. Patient is currently experiencing eye brow twitching on the right side. )   History of Present Illness: Terrionna Sutton is a 25 y.o. 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 She decreased her dose of medication from 400mg  to 200mg  due to intolerance and noticed problems such as sleep disturbance, jaw pain, eye twitching she was not sure which related to the medication or underlying issues. She feels that symptoms are partially improved with this, particularly TMJ pain also her right arm pain is improved since last visit and has not had a repeat episode of sharp chest pain. She does continue to have headaches and fatigue and right eyebrow twitching is worse.  DMARD Hx HCQ 08/2020-current  Labs 08/2020 ANA 1:320 speckled RF 42 CCP >250 SSA >8.0  08/2020 Vitamin D 26.7   Review of Systems  Constitutional: Positive for fatigue.  HENT: Negative for mouth sores, mouth dryness and nose dryness.   Eyes: Negative for pain, itching, visual disturbance and dryness.  Respiratory: Negative for cough, hemoptysis, shortness of breath and difficulty breathing.   Cardiovascular: Negative for chest pain, palpitations and swelling in legs/feet.  Gastrointestinal: Negative for abdominal pain, blood in stool, constipation and diarrhea.  Endocrine: Negative for increased urination.  Genitourinary: Negative for painful urination.  Musculoskeletal: Negative for arthralgias, joint pain, joint swelling, myalgias, muscle weakness, morning stiffness,  muscle tenderness and myalgias.  Skin: Negative for color change, rash and redness.  Allergic/Immunologic: Negative for susceptible to infections.  Neurological: Positive for headaches. Negative for dizziness, numbness, memory loss and weakness.  Hematological: Negative for swollen glands.  Psychiatric/Behavioral: Negative for confusion and sleep disturbance.    PMFS History:  Patient Active Problem List   Diagnosis Date Noted   Rheumatoid arthritis with rheumatoid factor of multiple sites without organ or systems involvement (HCC) 08/28/2020   High risk medication use 08/28/2020   Positive ANA (antinuclear antibody) 08/22/2020   Polyarthralgia 08/22/2020   Vitamin D deficiency 03/19/2019   Obesity with body mass index (BMI) of 30.0 to 39.9 03/19/2019   Reactive depression 09/25/2018   Migraine without aura and without status migrainosus, not intractable 09/25/2018   Cognitive complaints 09/25/2018    Past Medical History:  Diagnosis Date   ADHD    Anxiety    Depression    Depression    Phreesia 08/07/2020   Headache    PTSD (post-traumatic stress disorder)    Thyroid disease    hypothyroid    Family History  Problem Relation Age of Onset   Hypertension Mother    Cancer Father    History reviewed. No pertinent surgical history. Social History   Social History Narrative   Not on file    There is no immunization history on file for this patient.   Objective: Vital Signs: BP 124/85 (BP Location: Right Arm, Patient Position: Sitting, Cuff Size: Normal)    Pulse 85  Ht 5' 6.5" (1.689 m)    Wt 207 lb 6.4 oz (94.1 kg)    LMP 10/21/2020    BMI 32.97 kg/m    Physical Exam   Musculoskeletal Exam:  Shoulder, elbow, wrist, fingers full range of motion no tenderness or swelling Knees, ankles, full range of motion no tenderness or swelling  CDAI Exam: CDAI Score: 0.5  Patient Global: 3 mm; Provider Global: 2 mm Swollen: 0 ; Tender: 0  Joint Exam  10/25/2020   All documented joints were normal     Investigation: No additional findings.  Imaging: No results found.  Recent Labs: Lab Results  Component Value Date   WBC 10.8 (H) 07/26/2020   HGB 13.7 07/26/2020   PLT 355 07/26/2020   NA 140 08/10/2020   K 4.3 08/10/2020   CL 102 08/10/2020   CO2 23 08/10/2020   GLUCOSE 81 08/10/2020   BUN 6 08/10/2020   CREATININE 0.72 08/10/2020   BILITOT 0.6 08/10/2020   ALKPHOS 74 08/10/2020   AST 16 08/10/2020   ALT 11 08/10/2020   PROT 7.7 08/10/2020   ALBUMIN 5.0 08/10/2020   CALCIUM 9.8 08/10/2020   GFRAA 137 08/10/2020    Speciality Comments: No specialty comments available.  Procedures:  No procedures performed Allergies: Other   Assessment / Plan:     Visit Diagnoses: Positive ANA (antinuclear antibody) Rheumatoid arthritis with rheumatoid factor of multiple sites without organ or systems involvement (HCC)  Currently seems to be in low disease activity or remission by current exam and history. Recommend continuing the low dose HCQ 200mg  for now without change as she did not tolerate higher dose well and is doing okay. Recommended if new symptoms arise she can try interrupting medication for 4-5 days to see if related. No close monitoring required so can f/u in 6 mos or PRN.  High risk medication use  Discussed need to eye exam for ongoing use of HCQ to get baseline within 1 year of medication start with OCT can refer if needed else to send records when obtained.  Orders: No orders of the defined types were placed in this encounter.  Meds ordered this encounter  Medications   hydroxychloroquine (PLAQUENIL) 200 MG tablet    Sig: Take 1 tablet (200 mg total) by mouth daily.    Dispense:  90 tablet    Refill:  1     Follow-Up Instructions: Return in about 6 months (around 04/24/2021).   04/26/2021, MD  Note - This record has been created using Fuller Plan.  Chart creation errors have been  sought, but may not always  have been located. Such creation errors do not reflect on  the standard of medical care.

## 2020-10-27 ENCOUNTER — Ambulatory Visit: Payer: Medicaid Other | Admitting: Internal Medicine

## 2020-11-07 ENCOUNTER — Telehealth: Payer: Self-pay | Admitting: Neurology

## 2020-11-07 ENCOUNTER — Encounter: Payer: Self-pay | Admitting: Neurology

## 2020-11-07 ENCOUNTER — Ambulatory Visit (INDEPENDENT_AMBULATORY_CARE_PROVIDER_SITE_OTHER): Payer: 59 | Admitting: Neurology

## 2020-11-07 VITALS — BP 137/86 | HR 95 | Ht 66.0 in | Wt 209.4 lb

## 2020-11-07 DIAGNOSIS — G43009 Migraine without aura, not intractable, without status migrainosus: Secondary | ICD-10-CM | POA: Diagnosis not present

## 2020-11-07 DIAGNOSIS — G4489 Other headache syndrome: Secondary | ICD-10-CM | POA: Diagnosis not present

## 2020-11-07 MED ORDER — DICLOFENAC POTASSIUM 50 MG PO TABS: 50.0000 mg | ORAL_TABLET | Freq: Three times a day (TID) | ORAL | 1 refills | Status: DC | PRN

## 2020-11-07 MED ORDER — ALPRAZOLAM 0.5 MG PO TABS: ORAL_TABLET | ORAL | 0 refills | Status: DC

## 2020-11-07 MED ORDER — VENLAFAXINE HCL ER 37.5 MG PO CP24: ORAL_CAPSULE | ORAL | 2 refills | Status: DC

## 2020-11-07 NOTE — Telephone Encounter (Signed)
[employer]) called, notifying GNAhas paid insurance to current and insurance is active.

## 2020-11-07 NOTE — Progress Notes (Signed)
Reason for visit: Migraine headache  Referring physician: Dr. Nathaneil Canary  Maria Sutton is a 25 y.o. female  History of present illness:  Maria Sutton is a 25 year old right-handed Hispanic female with a history of migraine type headaches that have been an issue for her since she was in high school.  She indicates that when she tries to concentrate on something, this tends to activate her headaches.  Watching TV or looking at her cell phone or looking at a computer is also an activator.  She stopped going to college because of the headaches in part 1 year ago.  Being out of school has helped the headaches some but the headaches still are occurring 2-3 times a week.  The patient may get dizziness that may be associated with vertigo with the headache, but she denies any nausea or vomiting.  She does note photophobia and phonophobia.  She reports no numbness or weakness of the extremities or face associated with the headache.  At times, she may have pain that is severe enough that she has near syncope.  She does note some neck stiffness.  She denies any excessive caffeine intake and she denies a significant family history for headache.  In the past she has been placed on Topamax and then Trokendi without much benefit, she took Imitrex without benefit.  She switched to CBD oil 1 year ago with some benefit initially but the headaches are now coming back.  She is a taking Excedrin Migraine if needed without benefit.  She is sent to this office for further evaluation.  Past Medical History:  Diagnosis Date  . ADHD   . Anxiety   . Depression   . Depression    Phreesia 08/07/2020  . Headache   . PTSD (post-traumatic stress disorder)   . Thyroid disease    hypothyroid    History reviewed. No pertinent surgical history.  Family History  Problem Relation Age of Onset  . Hypertension Mother   . Cancer Father     Social history:  reports that she has never smoked. She has never used smokeless  tobacco. She reports previous alcohol use. She reports current drug use.  Medications:  Prior to Admission medications   Medication Sig Start Date End Date Taking? Authorizing Provider  Cholecalciferol (VITAMIN D3 PO) Take 1,000 Units by mouth. Takes 5000 iu   Yes [provider]  hydroxychloroquine (PLAQUENIL) 200 MG tablet Take 1 tablet (200 mg total) by mouth daily. 10/25/20 04/23/21 Yes Rice, Jamesetta Orleans, MD      Allergies  Allergen Reactions  . Other     "alcohol"    ROS:  Out of a complete 14 system review of symptoms, the patient complains only of the following symptoms, and all other reviewed systems are negative.  Headache Dizziness  Blood pressure 137/86, pulse 95, height 5\' 6"  (1.676 m), weight 209 lb 6.4 oz (95 kg), last menstrual period 10/21/2020.  Physical Exam  General: The patient is alert and cooperative at the time of the examination.  The patient is moderately obese.  Eyes: Pupils are equal, round, and reactive to light. Discs are flat bilaterally.  Neck: The neck is supple, no carotid bruits are noted.  Respiratory: The respiratory examination is clear.  Cardiovascular: The cardiovascular examination reveals a regular rate and rhythm, no obvious murmurs or rubs are noted.  Skin: Extremities are without significant edema.  Neurologic Exam  Mental status: The patient is alert and oriented x 3 at the time  of the examination. The patient has apparent normal recent and remote memory, with an apparently normal attention span and concentration ability.  Cranial nerves: Facial symmetry is present. There is good sensation of the face to pinprick and soft touch bilaterally. The strength of the facial muscles and the muscles to head turning and shoulder shrug are normal bilaterally. Speech is well enunciated, no aphasia or dysarthria is noted. Extraocular movements are full. Visual fields are full. The tongue is midline, and the patient has symmetric  elevation of the soft palate. No obvious hearing deficits are noted.  Motor: The motor testing reveals 5 over 5 strength of all 4 extremities. Good symmetric motor tone is noted throughout.  Sensory: Sensory testing is intact to pinprick, soft touch, vibration sensation, and position sense on all 4 extremities. No evidence of extinction is noted.  Coordination: Cerebellar testing reveals good finger-nose-finger and heel-to-shin bilaterally.  Gait and station: Gait is normal. Tandem gait is normal. Romberg is negative. No drift is seen.  Reflexes: Deep tendon reflexes are symmetric and normal bilaterally. Toes are downgoing bilaterally.   Assessment/Plan:  1.  Intractable migraine headache  The patient has not gained benefit previously with Topamax, she will be placed on Effexor.  We will work up to 75 mg a day, she will call for any dose adjustments.  The patient will be given diclofenac potassium to take if needed for the headache.  She will be set up for MRI of the brain.  She will follow up here in 3 months.  Marlan Palau MD 11/07/2020 11:53 AM  [facility] [address] [place], [place] [address]  Phone [phone] Fax [phone]

## 2020-11-07 NOTE — Patient Instructions (Signed)
We will start effexor for the headache prevention. May take diclofenac potassium 50 mg if needed for the headache.

## 2020-11-08 ENCOUNTER — Telehealth: Payer: Self-pay | Admitting: Neurology

## 2020-11-08 NOTE — Telephone Encounter (Signed)
[facility]: [phone] (exp. 11/08/20 to 12/07/20 order sent to GI. That is who is in network with the patient plan. They will reach out to the patient to schedule.

## 2020-11-10 ENCOUNTER — Ambulatory Visit
Admission: RE | Admit: 2020-11-10 | Discharge: 2020-11-10 | Disposition: A | Discharge status: Home / Self Care | Payer: 59 | Source: Ambulatory Visit | Attending: Neurology | Admitting: Neurology

## 2020-11-10 DIAGNOSIS — G4489 Other headache syndrome: Secondary | ICD-10-CM

## 2020-11-10 MED ORDER — GADOBENATE DIMEGLUMINE 529 MG/ML IV SOLN
20.0000 mL | Freq: Once | INTRAVENOUS | Status: AC | PRN
  Administered 2020-11-10: 20 mL via INTRAVENOUS

## 2020-11-11 ENCOUNTER — Telehealth: Payer: Self-pay | Admitting: Neurology

## 2020-11-11 ENCOUNTER — Encounter: Payer: Self-pay | Admitting: Neurology

## 2020-11-11 NOTE — Telephone Encounter (Signed)
I called the patient.  MRI of the brain was unremarkable.   MRI brain 11/10/20:  IMPRESSION: Unremarkable MRI scan of the brain with and without contrast.

## 2020-11-12 ENCOUNTER — Encounter: Payer: Self-pay | Admitting: Family Medicine

## 2020-12-28 ENCOUNTER — Encounter: Payer: Self-pay | Admitting: Family Medicine

## 2021-01-08 ENCOUNTER — Ambulatory Visit
Admission: RE | Admit: 2021-01-08 | Discharge: 2021-01-08 | Disposition: A | Discharge status: Home / Self Care | Payer: 59 | Source: Ambulatory Visit | Attending: Emergency Medicine | Admitting: Emergency Medicine

## 2021-01-08 ENCOUNTER — Other Ambulatory Visit: Payer: Self-pay

## 2021-01-08 VITALS — BP 127/87 | HR 95 | Temp 98.9°F | Resp 19

## 2021-01-08 DIAGNOSIS — J029 Acute pharyngitis, unspecified: Secondary | ICD-10-CM | POA: Diagnosis present

## 2021-01-08 LAB — POCT RAPID STREP A (OFFICE): Rapid Strep A Screen: NEGATIVE

## 2021-01-08 MED ORDER — FLUTICASONE PROPIONATE 50 MCG/ACT NA SUSP: 2.0000 | Freq: Every day | NASAL | 0 refills | Status: DC

## 2021-01-08 MED ORDER — BENZONATATE 200 MG PO CAPS: 200.0000 mg | ORAL_CAPSULE | Freq: Three times a day (TID) | ORAL | 0 refills | Status: DC | PRN

## 2021-01-08 NOTE — ED Provider Notes (Signed)
HPI  SUBJECTIVE:  Patient reports sore throat starting 2 days ago. Sx worse with swallowing, clearing her throat.  Sx better with Mucinex, DayQuil. Has been taking Zyrtec w/ o relief.  No fever, but has felt feverish   No neck stiffness  + Cough + nasal congestion, postnasal drip.  No rhinorrhea. No Myalgias No Headache No Rash  No loss of taste or smell No shortness of breath or difficulty breathing No nausea, vomiting No diarrhea No abdominal pain     No Recent Strep, flu, COVID exposure No reflux sxs No Allergy sxs  No Breathing difficulty, voice changes, sensation of throat swelling shut No Drooling No Trismus No abx in past month.  Did not get the COVID or flu vaccines No antipyretic in past 4-6 hrs Past medical history of lupus, rheumatoid arthritis on Plaquenil, hypothyroidism.  No history of frequent strep, DM, mono, chronic kidney disease.   LMP: Now.  Denies the possibility of being pregnant.  PMD: None..   Past Medical History:  Diagnosis Date  . ADHD   . Anxiety   . Depression   . Depression    Phreesia 08/07/2020  . Headache   . PTSD (post-traumatic stress disorder)   . Thyroid disease    hypothyroid    History reviewed. No pertinent surgical history.  Family History  Problem Relation Age of Onset  . Hypertension Mother   . Cancer Father     Social History   Tobacco Use  . Smoking status: Never Smoker  . Smokeless tobacco: Never Used  Vaping Use  . Vaping Use: Never used  Substance Use Topics  . Alcohol use: Not Currently  . Drug use: Yes    Comment: CBD    No current facility-administered medications for this encounter.  Current Outpatient Medications:  .  benzonatate (TESSALON) 200 MG capsule, Take 1 capsule (200 mg total) by mouth 3 (three) times daily as needed for cough., Disp: 30 capsule, Rfl: 0 .  fluticasone (FLONASE) 50 MCG/ACT nasal spray, Place 2 sprays into both nostrils daily., Disp: 16 g, Rfl: 0 .  ALPRAZolam (XANAX)  0.5 MG tablet, Take 2 tablets approximately 45 minutes prior to the MRI study, take a third tablet if needed., Disp: 3 tablet, Rfl: 0 .  Cholecalciferol (VITAMIN D3 PO), Take 1,000 Units by mouth. Takes 5000 iu, Disp: , Rfl:  .  diclofenac (CATAFLAM) 50 MG tablet, Take 1 tablet (50 mg total) by mouth 3 (three) times daily as needed., Disp: 20 tablet, Rfl: 1 .  hydroxychloroquine (PLAQUENIL) 200 MG tablet, Take 1 tablet (200 mg total) by mouth daily., Disp: 90 tablet, Rfl: 1 .  venlafaxine XR (EFFEXOR XR) 37.5 MG 24 hr capsule, One tablet daily for 2 weeks, then take 2 a day, Disp: 60 capsule, Rfl: 2  Allergies  Allergen Reactions  . Other     "alcohol"     ROS  As noted in HPI.   Physical Exam  BP 127/87   Pulse 95   Temp 98.9 F (37.2 C)   Resp 19   LMP 01/08/2021   SpO2 97%   Constitutional: Well developed, well nourished, no acute distress.  Coughing Eyes:  EOMI, conjunctiva normal bilaterally HENT: Normocephalic, atraumatic,mucus membranes moist. - nasal congestion + erythematous oropharynx  - enlarged tonsils  - exudates. Uvula midline.  No obvious postnasal drip Respiratory: Normal inspiratory effort Cardiovascular: Normal rate, no murmurs, rubs, gallops GI: nondistended, nontender. No appreciable splenomegaly skin: No rash, skin intact Lymph: -  Anterior cervical LN.  No posterior cervical lymphadenopathy Musculoskeletal: no deformities Neurologic: Alert & oriented x 3, no focal neuro deficits Psychiatric: Speech and behavior appropriate.  ED Course   Medications - No data to display  Orders Placed This Encounter  Procedures  . Culture, group A strep    Standing Status:   Standing    Number of Occurrences:   1  . POCT rapid strep A    Standing Status:   Standing    Number of Occurrences:   1    Results for orders placed or performed during the hospital encounter of 01/08/21 (from the past 24 hour(s))  POCT rapid strep A     Status: None   Collection Time:  01/08/21 12:26 PM  Result Value Ref Range   Rapid Strep A Screen Negative Negative   No results found.  ED Clinical Impression  1. Acute pharyngitis, unspecified etiology      ED Assessment/Plan  Rapid strep negative. Obtaining throat culture to guide antibiotic treatment. Discussed this with patient.Maryclare Labrador contact her if culture is positive, and will call in Appropriate antibiotics. Patient home with Benadryl/Maalox mixture, Flonase, saline nasal irrigation, Tessalon, continue Mucinex or may switch to Mucinex D. Patient to followup with PMD of choice when necessary, will refer to local primary care resources.  Will order assistance in finding a PMD.   Discussed labs,  MDM, plan and followup with patient. Discussed sn/sx that should prompt return to the ED. patient agrees with plan.   Meds ordered this encounter  Medications  . fluticasone (FLONASE) 50 MCG/ACT nasal spray    Sig: Place 2 sprays into both nostrils daily.    Dispense:  16 g    Refill:  0  . benzonatate (TESSALON) 200 MG capsule    Sig: Take 1 capsule (200 mg total) by mouth 3 (three) times daily as needed for cough.    Dispense:  30 capsule    Refill:  0     *This clinic note was created using Scientist, clinical (histocompatibility and immunogenetics). Therefore, there may be occasional mistakes despite careful proofreading.     Domenick Gong, MD 01/09/21 [phone]

## 2021-01-08 NOTE — ED Triage Notes (Signed)
Pt presents with concern for strep throat. Reports sore throat and possible for fever x 3 days. Reports minimal relief with mucinex.

## 2021-01-08 NOTE — Discharge Instructions (Addendum)
your rapid strep was negative today, so we have sent off a throat culture.  We will contact you and call in the appropriate antibiotics if your culture comes back positive for an infection requiring antibiotic treatment.  Give [place] a working phone number.  If you were given a prescription for antibiotics, you may want to wait and fill it until you know the results of the culture.  Flonase, saline nasal irrigation for the nasal congestion.  Continue Mucinex or may switch to Mucinex D for the nasal congestion.  Tessalon will help with the cough.  Make sure you drink plenty of extra fluids.  Some people find salt water gargles and  Traditional Medicinal's "Throat Coat" tea helpful. Take 5 mL of liquid Benadryl and 5 mL of Maalox. Mix it together, and then hold it in your mouth for as long as you can and then swallow. You may do this 4 times a day.    Below is a list of primary care practices who are taking new patients for you to follow-up with.  [facility] Ground Floor - [facility], [address] [place], [place], [place] [address] [phone]  [facility] [address] [place], [place] [address] [phone]  [facility] [address] [place], [place] [address] [phone]  [facility] [phone] [address] [place] [address]  [facility]: [address] [place] [address]  [phone]  [facility]: [address] [place] [address]   [phone]  [facility]: [address] [place] [address]  [phone]  [facility] : [address] [place] [address] [phone]  [facility]: [address] [place] [address] [phone]  [facility]: [address] [place] [address] [phone]  Dr. Oneal Grout [address] [facility] [place] [facility] [place] [address]  [phone]  Dr. Jackie Plum, [facility]. [address] [place], [place] [address]  [phone]   Go to www.goodrx.com to look up your medications. This will give you a list of where you can find your prescriptions at the most affordable prices. Or ask the pharmacist what the cash price is, or if they have any other discount programs available to help make your medication more affordable. This can be less expensive than what you would pay with insurance.

## 2021-01-10 ENCOUNTER — Telehealth (HOSPITAL_COMMUNITY): Payer: Self-pay | Admitting: Emergency Medicine

## 2021-01-10 LAB — CULTURE, GROUP A STREP (THRC)

## 2021-01-10 MED ORDER — PENICILLIN V POTASSIUM 500 MG PO TABS: 500.0000 mg | ORAL_TABLET | Freq: Two times a day (BID) | ORAL | 0 refills | Status: AC

## 2021-01-15 ENCOUNTER — Encounter (HOSPITAL_BASED_OUTPATIENT_CLINIC_OR_DEPARTMENT_OTHER): Payer: Self-pay | Admitting: *Deleted

## 2021-01-15 ENCOUNTER — Emergency Department (HOSPITAL_BASED_OUTPATIENT_CLINIC_OR_DEPARTMENT_OTHER)
Admission: EM | Admit: 2021-01-15 | Discharge: 2021-01-16 | Disposition: A | Discharge status: Home / Self Care | Payer: 59 | Attending: Emergency Medicine | Admitting: Emergency Medicine

## 2021-01-15 ENCOUNTER — Other Ambulatory Visit: Payer: Self-pay

## 2021-01-15 ENCOUNTER — Emergency Department (HOSPITAL_BASED_OUTPATIENT_CLINIC_OR_DEPARTMENT_OTHER): Payer: 59

## 2021-01-15 DIAGNOSIS — Z79899 Other long term (current) drug therapy: Secondary | ICD-10-CM | POA: Diagnosis not present

## 2021-01-15 DIAGNOSIS — D582 Other hemoglobinopathies: Secondary | ICD-10-CM | POA: Diagnosis not present

## 2021-01-15 DIAGNOSIS — R1032 Left lower quadrant pain: Secondary | ICD-10-CM | POA: Diagnosis present

## 2021-01-15 LAB — URINALYSIS, ROUTINE W REFLEX MICROSCOPIC
Bilirubin Urine: NEGATIVE
Glucose, UA: NEGATIVE mg/dL
Hgb urine dipstick: NEGATIVE
Ketones, ur: NEGATIVE mg/dL
Leukocytes,Ua: NEGATIVE
Nitrite: NEGATIVE
Protein, ur: NEGATIVE mg/dL
Specific Gravity, Urine: 1.025 (ref 1.005–1.030)
pH: 6.5 (ref 5.0–8.0)

## 2021-01-15 LAB — CBC WITH DIFFERENTIAL/PLATELET
Abs Immature Granulocytes: 0.02 10*3/uL (ref 0.00–0.07)
Basophils Absolute: 0 10*3/uL (ref 0.0–0.1)
Basophils Relative: 0 %
Eosinophils Absolute: 0.1 10*3/uL (ref 0.0–0.5)
Eosinophils Relative: 1 %
HCT: 46.9 % — ABNORMAL HIGH (ref 36.0–46.0)
Hemoglobin: 15.8 g/dL — ABNORMAL HIGH (ref 12.0–15.0)
Immature Granulocytes: 0 %
Lymphocytes Relative: 35 %
Lymphs Abs: 3.4 10*3/uL (ref 0.7–4.0)
MCH: 28.1 pg (ref 26.0–34.0)
MCHC: 33.7 g/dL (ref 30.0–36.0)
MCV: 83.3 fL (ref 80.0–100.0)
Monocytes Absolute: 0.6 10*3/uL (ref 0.1–1.0)
Monocytes Relative: 6 %
Neutro Abs: 5.7 10*3/uL (ref 1.7–7.7)
Neutrophils Relative %: 58 %
Platelets: 303 10*3/uL (ref 150–400)
RBC: 5.63 MIL/uL — ABNORMAL HIGH (ref 3.87–5.11)
RDW: 12.3 % (ref 11.5–15.5)
WBC: 9.8 10*3/uL (ref 4.0–10.5)
nRBC: 0 % (ref 0.0–0.2)

## 2021-01-15 LAB — COMPREHENSIVE METABOLIC PANEL
ALT: 33 U/L (ref 0–44)
AST: 21 U/L (ref 15–41)
Albumin: 4.6 g/dL (ref 3.5–5.0)
Alkaline Phosphatase: 63 U/L (ref 38–126)
Anion gap: 9 (ref 5–15)
BUN: 10 mg/dL (ref 6–20)
CO2: 27 mmol/L (ref 22–32)
Calcium: 9.2 mg/dL (ref 8.9–10.3)
Chloride: 101 mmol/L (ref 98–111)
Creatinine, Ser: 0.6 mg/dL (ref 0.44–1.00)
GFR, Estimated: >60 mL/min (ref >60)
Glucose, Bld: 88 mg/dL (ref 70–99)
Potassium: 3.8 mmol/L (ref 3.5–5.1)
Sodium: 137 mmol/L (ref 135–145)
Total Bilirubin: 0.5 mg/dL (ref 0.3–1.2)
Total Protein: 8.2 g/dL — ABNORMAL HIGH (ref 6.5–8.1)

## 2021-01-15 LAB — PREGNANCY, URINE: Preg Test, Ur: NEGATIVE

## 2021-01-15 NOTE — ED Triage Notes (Signed)
C/o left lower abd pain x 3 days

## 2021-01-15 NOTE — ED Provider Notes (Signed)
[facility] EMERGENCY DEPARTMENT Provider Note   CSN: [id number] Arrival date & time: 01/15/21  2120     History Chief Complaint  Patient presents with  . Abdominal Pain    Maria Sutton is a 25 y.o. female.  HPI Patient is a 25 year old female with a medical history as noted below.  She presents the emergency department with left lower abd pain for the past 2 days.  She states her pain has been constant until today.  She states this afternoon it briefly subsided and then worsened once again.  She denies any fevers, chills, nausea, vomiting, chest pain, shortness of breath, other regions of abdominal pain, vaginal discharge, dysuria.  She states she has not been sexually active for more than 6 months.  Her last menstrual cycle was on March 31 and was normal.    Past Medical History:  Diagnosis Date  . ADHD   . Anxiety   . Depression   . Depression    Phreesia 08/07/2020  . Headache   . PTSD (post-traumatic stress disorder)   . Thyroid disease    hypothyroid    Patient Active Problem List   Diagnosis Date Noted  . Rheumatoid arthritis with rheumatoid factor of multiple sites without organ or systems involvement (HCC) 08/28/2020  . High risk medication use 08/28/2020  . Positive ANA (antinuclear antibody) 08/22/2020  . Polyarthralgia 08/22/2020  . Vitamin D deficiency 03/19/2019  . Obesity with body mass index (BMI) of 30.0 to 39.9 03/19/2019  . Reactive depression 09/25/2018  . Migraine without aura and without status migrainosus, not intractable 09/25/2018  . Cognitive complaints 09/25/2018    History reviewed. No pertinent surgical history.   OB History   No obstetric history on file.     Family History  Problem Relation Age of Onset  . Hypertension Mother   . Cancer Father     Social History   Tobacco Use  . Smoking status: Never Smoker  . Smokeless tobacco: Never Used  Vaping Use  . Vaping Use: Never used  Substance Use Topics  . Alcohol  use: Not Currently  . Drug use: Yes    Comment: CBD    Home Medications Prior to Admission medications   Medication Sig Start Date End Date Taking? Authorizing Provider  penicillin v potassium (VEETID) 500 MG tablet Take 1 tablet (500 mg total) by mouth in the morning and at bedtime for 10 days. 01/10/21 01/20/21  Merrilee Jansky, MD  ALPRAZolam Prudy Feeler) 0.5 MG tablet Take 2 tablets approximately 45 minutes prior to the MRI study, take a third tablet if needed. 11/07/20   York Spaniel, MD  benzonatate (TESSALON) 200 MG capsule Take 1 capsule (200 mg total) by mouth 3 (three) times daily as needed for cough. 01/08/21   Domenick Gong, MD  Cholecalciferol (VITAMIN D3 PO) Take 1,000 Units by mouth. Takes 5000 iu    [provider]  diclofenac (CATAFLAM) 50 MG tablet Take 1 tablet (50 mg total) by mouth 3 (three) times daily as needed. 11/07/20   York Spaniel, MD  fluticasone (FLONASE) 50 MCG/ACT nasal spray Place 2 sprays into both nostrils daily. 01/08/21   Domenick Gong, MD  hydroxychloroquine (PLAQUENIL) 200 MG tablet Take 1 tablet (200 mg total) by mouth daily. 10/25/20 04/23/21  Fuller Plan, MD  venlafaxine XR (EFFEXOR XR) 37.5 MG 24 hr capsule One tablet daily for 2 weeks, then take 2 a day 11/07/20   York Spaniel, MD  Allergies    Other  Review of Systems   Review of Systems  All other systems reviewed and are negative. Ten systems reviewed and are negative for acute change, except as noted in the HPI.   Physical Exam Updated Vital Signs BP 129/83 (BP Location: Right Arm)   Pulse 83   Temp 98.4 F (36.9 C) (Oral)   Resp 17   Ht 5\' 6"  (1.676 m)   Wt 96.5 kg   LMP 01/04/2021   SpO2 100%   BMI 34.35 kg/m   Physical Exam Vitals and nursing note reviewed.  Constitutional:      General: She is not in acute distress.    Appearance: Normal appearance. She is not ill-appearing, toxic-appearing or diaphoretic.  HENT:     Head: Normocephalic and  atraumatic.     Right Ear: External ear normal.     Left Ear: External ear normal.     Nose: Nose normal.     Mouth/Throat:     Mouth: Mucous membranes are moist.     Pharynx: Oropharynx is clear. No oropharyngeal exudate or posterior oropharyngeal erythema.  Eyes:     Extraocular Movements: Extraocular movements intact.  Cardiovascular:     Rate and Rhythm: Normal rate and regular rhythm.     Pulses: Normal pulses.     Heart sounds: Normal heart sounds. No murmur heard. No friction rub. No gallop.   Pulmonary:     Effort: Pulmonary effort is normal. No respiratory distress.     Breath sounds: Normal breath sounds. No stridor. No wheezing, rhonchi or rales.  Abdominal:     General: Abdomen is flat.     Tenderness: There is abdominal tenderness in the left lower quadrant.  Musculoskeletal:        General: Normal range of motion.     Cervical back: Normal range of motion and neck supple. No tenderness.  Skin:    General: Skin is warm and dry.  Neurological:     General: No focal deficit present.     Mental Status: She is alert and oriented to person, place, and time.  Psychiatric:        Mood and Affect: Mood normal.        Behavior: Behavior normal.    ED Results / Procedures / Treatments   Labs (all labs ordered are listed, but only abnormal results are displayed) Labs Reviewed  COMPREHENSIVE METABOLIC PANEL - Abnormal; Notable for the following components:      Result Value   Total Protein 8.2 (*)    All other components within normal limits  CBC WITH DIFFERENTIAL/PLATELET - Abnormal; Notable for the following components:   RBC 5.63 (*)    Hemoglobin 15.8 (*)    HCT 46.9 (*)    All other components within normal limits  URINALYSIS, ROUTINE W REFLEX MICROSCOPIC  PREGNANCY, URINE   EKG None  Radiology 01/06/2021 Art/Ven Flow Abd Pelv Doppler  Result Date: 01/15/2021 CLINICAL DATA:  Initial evaluation for acute left pelvic pain, concern for torsion. EXAM: TRANSABDOMINAL  AND TRANSVAGINAL ULTRASOUND OF PELVIS DOPPLER ULTRASOUND OF OVARIES TECHNIQUE: Both transabdominal and transvaginal ultrasound examinations of the pelvis were performed. Transabdominal technique was performed for global imaging of the pelvis including uterus, ovaries, adnexal regions, and pelvic cul-de-sac. It was necessary to proceed with endovaginal exam following the transabdominal exam to visualize the uterus, endometrium, and ovaries. Color and duplex Doppler ultrasound was utilized to evaluate blood flow to the ovaries. COMPARISON:  None. FINDINGS: Uterus  Measurements: 6.5 x 3.3 x 4.3 cm = volume: 47.2 mL. No fibroids or other mass visualized. Endometrium Thickness: 5.9 mm.  No focal abnormality visualized. Right ovary Measurements: 3.7 x 3.4 x 2.9 cm = volume: 19.4 mL. Normal appearance/no adnexal mass. Small dominant follicle noted. Left ovary Measurements: 3.0 x 4.2 x 1.9 cm = volume: 12.4 mL. Normal appearance/no adnexal mass. Pulsed Doppler evaluation of both ovaries demonstrates normal low-resistance arterial and venous waveforms. Other findings Moderate volume free fluid within the pelvis, presumably physiologic. IMPRESSION: 1. Moderate volume free fluid within the pelvis, presumably physiologic. 2. Otherwise unremarkable and normal pelvic ultrasound. No evidence for torsion or other acute abnormality. Electronically Signed   By: Rise Mu M.D.   On: 01/15/2021 23:50    Procedures Procedures   Medications Ordered in ED Medications - No data to display  ED Course  I have reviewed the triage vital signs and the nursing notes.  Pertinent labs & imaging results that were available during my care of the patient were reviewed by me and considered in my medical decision making (see chart for details).    MDM Rules/Calculators/A&P                          Pt is a 25 y/o female that presents to the ED due to LLQ abdominal pain.  CBC with elevated hemoglobin at 15.8 CMP with a  total protein of 8.2 UA negative.  Pregnancy test negative.  Ultrasound obtained of the pelvis showing moderate volume free fluid within the pelvis which is presumably physiologic. No evidence of torsion or other acute abnormality.  Patient's lab work and imaging was reassuring. This was discussed with the patient and she seemed relieved. I recommended a pelvic exam but given her reassuring workup she declined. I feel that this is reasonable. Pt has no other complaints besides her intermittent pain. No fevers, chills, n/v/d, vaginal discharge, or urinary sx.   Unsure of the source of her pain.  Her LMP was about 10 days ago and was normal.  Recommended she continue to monitor her sx closely and come back if they worsen. Tylenol/ibuprofen for pain. Feel that she is stable for d/c and she is agreeable. Her questions were answered and she was amicable at the time of d/c.   Final Clinical Impression(s) / ED Diagnoses Final diagnoses:  LLQ pain   Rx / DC Orders ED Discharge Orders    None       Placido Sou, PA-C 01/17/21 1003    Terald Sleeper, MD 01/17/21 [phone]

## 2021-01-16 NOTE — Discharge Instructions (Signed)
Please continue to monitor your symptoms.  Like we discussed, you can take ibuprofen as needed for your pain.  I have given you a referral for an OB/GYN which is below.  Please give them a call and schedule a follow-up appointment.  If you develop any new or worsening symptoms such as pain that is out of control, nausea, vomiting, diarrhea, fevers, please immediately return to the emergency department so that you can be reevaluated.  It was a pleasure to meet you.

## 2021-01-18 ENCOUNTER — Inpatient Hospital Stay: Admission: RE | Admit: 2021-01-18 | Payer: Self-pay | Source: Ambulatory Visit

## 2021-01-18 ENCOUNTER — Ambulatory Visit
Admission: EM | Admit: 2021-01-18 | Discharge: 2021-01-18 | Disposition: A | Discharge status: Home / Self Care | Payer: 59 | Attending: Family Medicine | Admitting: Family Medicine

## 2021-01-18 DIAGNOSIS — J069 Acute upper respiratory infection, unspecified: Secondary | ICD-10-CM | POA: Diagnosis not present

## 2021-01-18 DIAGNOSIS — R0982 Postnasal drip: Secondary | ICD-10-CM

## 2021-01-18 HISTORY — DX: Systemic lupus erythematosus, unspecified: M32.9

## 2021-01-18 HISTORY — DX: Reserved for concepts with insufficient information to code with codable children: IMO0002

## 2021-01-18 HISTORY — DX: Rheumatoid arthritis, unspecified: M06.9

## 2021-01-18 MED ORDER — LEVOCETIRIZINE DIHYDROCHLORIDE 5 MG PO TABS: 5.0000 mg | ORAL_TABLET | Freq: Every evening | ORAL | 0 refills | Status: DC

## 2021-01-18 MED ORDER — PSEUDOEPH-BROMPHEN-DM 30-2-10 MG/5ML PO SYRP: 5.0000 mL | ORAL_SOLUTION | Freq: Four times a day (QID) | ORAL | 0 refills | Status: DC | PRN

## 2021-01-18 NOTE — ED Triage Notes (Addendum)
Pt reports being seen here for sore throat and put on abx. States throat feeling better but has developed a cough. Pt has tried benadryl, Dayquil and claritin D but this has not helped. Reports sweating at night.

## 2021-01-18 NOTE — ED Provider Notes (Signed)
[facility]    CSN: [id number] Arrival date & time: 01/18/21  1345      History   Chief Complaint Chief Complaint  Patient presents with  . Sore Throat  . Cough    HPI Maria Sutton is a 25 y.o. female.   HPI  Presents today for evaluation of a persistent cough.  Patient was seen here in clinic the first week of April diagnosed with strep throat covered with penicillin G along with benzonatate and and Flonase.  Patient reports shortly after starting the penicillin G she developed a cough that has progressed.  She was advised by one of her nurses that the symptoms are likely related to allergies and therefore he started taking some over-the-counter allergy medicine without any subsequent relief.  She has been afebrile.  Cough is nonproductive.  Denies any shortness of breath or wheezing.   Past Medical History:  Diagnosis Date  . ADHD   . Anxiety   . Depression   . Depression    Phreesia 08/07/2020  . Headache   . Lupus (HCC)   . PTSD (post-traumatic stress disorder)   . Rheumatoid arthritis (HCC)   . Thyroid disease    hypothyroid    Patient Active Problem List   Diagnosis Date Noted  . Rheumatoid arthritis with rheumatoid factor of multiple sites without organ or systems involvement (HCC) 08/28/2020  . High risk medication use 08/28/2020  . Positive ANA (antinuclear antibody) 08/22/2020  . Polyarthralgia 08/22/2020  . Vitamin D deficiency 03/19/2019  . Obesity with body mass index (BMI) of 30.0 to 39.9 03/19/2019  . Reactive depression 09/25/2018  . Migraine without aura and without status migrainosus, not intractable 09/25/2018  . Cognitive complaints 09/25/2018    History reviewed. No pertinent surgical history.  OB History   No obstetric history on file.      Home Medications    Prior to Admission medications   Medication Sig Start Date End Date Taking? Authorizing Provider  brompheniramine-pseudoephedrine-DM 30-2-10 MG/5ML syrup Take 5  mLs by mouth 4 (four) times daily as needed. 01/18/21  Yes Bing Neighbors, FNP  Cholecalciferol (VITAMIN D3 PO) Take 1,000 Units by mouth. Takes 5000 iu   Yes [provider]  hydroxychloroquine (PLAQUENIL) 200 MG tablet Take 1 tablet (200 mg total) by mouth daily. 10/25/20 04/23/21 Yes Rice, Jamesetta Orleans, MD  levocetirizine (XYZAL) 5 MG tablet Take 1 tablet (5 mg total) by mouth every evening. 01/18/21  Yes Bing Neighbors, FNP  penicillin v potassium (VEETID) 500 MG tablet Take 1 tablet (500 mg total) by mouth in the morning and at bedtime for 10 days. 01/10/21 01/20/21  Merrilee Jansky, MD  ALPRAZolam Prudy Feeler) 0.5 MG tablet Take 2 tablets approximately 45 minutes prior to the MRI study, take a third tablet if needed. 11/07/20   York Spaniel, MD  benzonatate (TESSALON) 200 MG capsule Take 1 capsule (200 mg total) by mouth 3 (three) times daily as needed for cough. 01/08/21   Domenick Gong, MD  diclofenac (CATAFLAM) 50 MG tablet Take 1 tablet (50 mg total) by mouth 3 (three) times daily as needed. 11/07/20   York Spaniel, MD  fluticasone (FLONASE) 50 MCG/ACT nasal spray Place 2 sprays into both nostrils daily. 01/08/21   Domenick Gong, MD  venlafaxine XR (EFFEXOR XR) 37.5 MG 24 hr capsule One tablet daily for 2 weeks, then take 2 a day 11/07/20   York Spaniel, MD    Family History Family History  Problem Relation Age of Onset  . Hypertension Mother   . Cancer Father     Social History Social History   Tobacco Use  . Smoking status: Never Smoker  . Smokeless tobacco: Never Used  Vaping Use  . Vaping Use: Never used  Substance Use Topics  . Alcohol use: Not Currently  . Drug use: Yes    Comment: CBD     Allergies   Other   Review of Systems Review of Systems Pertinent negatives listed in HPI  Physical Exam Triage Vital Signs ED Triage Vitals  Enc Vitals Group     BP 01/18/21 1431 116/84     Pulse Rate 01/18/21 1431 85     Resp 01/18/21 1431 18      Temp 01/18/21 1431 99 F (37.2 C)     Temp src --      SpO2 01/18/21 1431 97 %     Weight --      Height --      Head Circumference --      Peak Flow --      Pain Score 01/18/21 1422 0     Pain Loc --      Pain Edu? --      Excl. in GC? --    No data found.  Updated Vital Signs BP 116/84   Pulse 85   Temp 99 F (37.2 C)   Resp 18   LMP 01/04/2021   SpO2 97%   Visual Acuity Right Eye Distance:   Left Eye Distance:   Bilateral Distance:    Right Eye Near:   Left Eye Near:    Bilateral Near:     Physical Exam  General Appearance:    Alert, cooperative, no distress  HENT:   Normocephalic, ears normal, nares mucosal edema with congestion, rhinorrhea, oropharynx clear   Eyes:    PERRL, conjunctiva/corneas clear, EOM's intact       Lungs:     Clear to auscultation bilaterally, respirations unlabored  Heart:    Regular rate and rhythm  Neurologic:   Awake, alert, oriented x 3. No apparent focal neurological           defect.      UC Treatments / Results  Labs (all labs ordered are listed, but only abnormal results are displayed) Labs Reviewed - No data to display  EKG   Radiology No results found.  Procedures Procedures (including critical care time)  Medications Ordered in UC Medications - No data to display  Initial Impression / Assessment and Plan / UC Course  I have reviewed the triage vital signs and the nursing notes.  Pertinent labs & imaging results that were available during my care of the patient were reviewed by me and considered in my medical decision making (see chart for details).     Viral URI with cough, discontinue Claritin start levocetirizine 5 mg daily at bedtime and Bromfed cough syrup with decongestant 4 times daily as needed for cough.  Symptoms are viral in etiology and likely related to outdoor allergens.  Symptoms are self-limiting and will resolve with time. Final Clinical Impressions(s) / UC Diagnoses   Final diagnoses:   Viral URI with cough  Post-nasal drainage   Discharge Instructions   None    ED Prescriptions    Medication Sig Dispense Auth. Provider   brompheniramine-pseudoephedrine-DM 30-2-10 MG/5ML syrup Take 5 mLs by mouth 4 (four) times daily as needed. 120 mL Bing Neighbors, FNP   levocetirizine (  XYZAL) 5 MG tablet Take 1 tablet (5 mg total) by mouth every evening. 90 tablet Bing Neighbors, FNP     PDMP not reviewed this encounter.   Bing Neighbors, FNP 01/18/21 1506

## 2021-01-21 ENCOUNTER — Emergency Department (HOSPITAL_BASED_OUTPATIENT_CLINIC_OR_DEPARTMENT_OTHER)
Admission: EM | Admit: 2021-01-21 | Discharge: 2021-01-21 | Disposition: A | Discharge status: Home / Self Care | Payer: 59 | Attending: Emergency Medicine | Admitting: Emergency Medicine

## 2021-01-21 ENCOUNTER — Other Ambulatory Visit: Payer: Self-pay

## 2021-01-21 ENCOUNTER — Emergency Department (HOSPITAL_BASED_OUTPATIENT_CLINIC_OR_DEPARTMENT_OTHER): Payer: 59

## 2021-01-21 ENCOUNTER — Encounter (HOSPITAL_BASED_OUTPATIENT_CLINIC_OR_DEPARTMENT_OTHER): Payer: Self-pay | Admitting: *Deleted

## 2021-01-21 DIAGNOSIS — Z20822 Contact with and (suspected) exposure to covid-19: Secondary | ICD-10-CM | POA: Insufficient documentation

## 2021-01-21 DIAGNOSIS — E039 Hypothyroidism, unspecified: Secondary | ICD-10-CM | POA: Insufficient documentation

## 2021-01-21 DIAGNOSIS — J069 Acute upper respiratory infection, unspecified: Secondary | ICD-10-CM | POA: Diagnosis not present

## 2021-01-21 DIAGNOSIS — R059 Cough, unspecified: Secondary | ICD-10-CM | POA: Diagnosis present

## 2021-01-21 LAB — RESP PANEL BY RT-PCR (FLU A&B, COVID) ARPGX2
Influenza A by PCR: NEGATIVE
Influenza B by PCR: NEGATIVE
SARS Coronavirus 2 by RT PCR: NEGATIVE

## 2021-01-21 MED ORDER — ALBUTEROL SULFATE HFA 108 (90 BASE) MCG/ACT IN AERS: 1.0000 | INHALATION_SPRAY | Freq: Four times a day (QID) | RESPIRATORY_TRACT | 0 refills | Status: DC | PRN

## 2021-01-21 NOTE — ED Notes (Signed)
AMA process explained and MSE waiver signed by patient. 

## 2021-01-21 NOTE — Discharge Instructions (Signed)

## 2021-01-21 NOTE — ED Triage Notes (Signed)
Pt reports cough due to pollen. She had a negative strep test at Urgent care and was given meds for allergies and cough suppressant. Dry cough. Denies fever

## 2021-01-21 NOTE — ED Triage Notes (Signed)
Emergency Medicine Provider Triage Evaluation Note  Maria Sutton , a 25 y.o. female  was evaluated in triage.  Pt complains of frequent coughing, has been going on for last couple weeks, she was seen at urgent care where she had a negative strep test, but her culture shows possible strep she is on antibiotics and still continues to have a cough.  Patient was then given allergy meds but still continues to cough.  She has a sore throat, with a nonproductive cough.  She denies tongue or throat swelling, difficulty swallowing.  Never had this happen in the past.  Review of Systems  Positive: Patient has sore throat productive cough Negative: Patient denies tongue or throat swelling difficulty swallowing  Physical Exam  BP (!) 140/97 (BP Location: Right Arm)   Pulse 92   Temp 99.3 F (37.4 C) (Oral)   Resp 18   Ht 5\' 6"  (1.676 m)   Wt 96.5 kg   LMP 01/08/2021   SpO2 100%   BMI 34.34 kg/m  Gen:   Awake, no distress   HEENT:  Atraumatic  Resp:  Normal effort  Cardiac:  Normal rate  MSK:   Moves extremities without difficulty  Neuro:  Speech clear   Medical Decision Making  Medically screening exam initiated at 7:16 PM.  Appropriate orders placed.  Natajah Derderian was informed that the remainder of the evaluation will be completed by another provider, this initial triage assessment does not replace that evaluation, and the importance of remaining in the ED until their evaluation is complete.  Clinical Impression  Presents with sore throat and coughing, patient will need further work-up here in the emergency department.   Buelah Manis, PA-C 01/21/21 1920

## 2021-01-21 NOTE — ED Provider Notes (Signed)
Emergency Department Provider Note   I have reviewed the triage vital signs and the nursing notes.   HISTORY  Chief Complaint Allergies   HPI Maria Sutton is a 25 y.o. female presents to the ED with continued cough and congestion for the last several weeks. She has been seen at Urgent Care in the last several days and reports testing positive for strep by culture and compliant with abx. Her symptoms are improving with the exception of cough. No CP or SOB. No difficulty swallowing. No sore throat. No earache. She has been taking OTC medications for cough with no lasting improvement. No radiation of symptoms or modifying factors.    Past Medical History:  Diagnosis Date  . ADHD   . Anxiety   . Depression   . Depression    Phreesia 08/07/2020  . Headache   . Lupus (HCC)   . PTSD (post-traumatic stress disorder)   . Rheumatoid arthritis (HCC)   . Thyroid disease    hypothyroid    Patient Active Problem List   Diagnosis Date Noted  . Rheumatoid arthritis with rheumatoid factor of multiple sites without organ or systems involvement (HCC) 08/28/2020  . High risk medication use 08/28/2020  . Positive ANA (antinuclear antibody) 08/22/2020  . Polyarthralgia 08/22/2020  . Vitamin D deficiency 03/19/2019  . Obesity with body mass index (BMI) of 30.0 to 39.9 03/19/2019  . Reactive depression 09/25/2018  . Migraine without aura and without status migrainosus, not intractable 09/25/2018  . Cognitive complaints 09/25/2018    History reviewed. No pertinent surgical history.  Allergies Other  Family History  Problem Relation Age of Onset  . Hypertension Mother   . Cancer Father     Social History Social History   Tobacco Use  . Smoking status: Never Smoker  . Smokeless tobacco: Never Used  Vaping Use  . Vaping Use: Never used  Substance Use Topics  . Alcohol use: Not Currently  . Drug use: Yes    Comment: CBD    Review of Systems  Constitutional: No  fever/chills Eyes: No visual changes. ENT: No sore throat. Positive congestion.  Cardiovascular: Denies chest pain. Respiratory: Denies shortness of breath. Positive cough.  Gastrointestinal: No abdominal pain. Musculoskeletal: Negative for back pain. Skin: Negative for rash. Neurological: Negative for headaches.  10-point ROS otherwise negative.  ____________________________________________   PHYSICAL EXAM:  VITAL SIGNS: ED Triage Vitals  Enc Vitals Group     BP 01/21/21 1719 (!) 140/97     Pulse Rate 01/21/21 1719 92     Resp 01/21/21 1719 18     Temp 01/21/21 1719 99.3 F (37.4 C)     Temp Source 01/21/21 1719 Oral     SpO2 01/21/21 1719 100 %     Weight 01/21/21 1727 212 lb 11.9 oz (96.5 kg)     Height 01/21/21 1727 5\' 6"  (1.676 m)   Constitutional: Alert and oriented. Well appearing and in no acute distress. Eyes: Conjunctivae are normal. Head: Atraumatic. Nose: Mild congestion/rhinnorhea. Mouth/Throat: Mucous membranes are moist.  Oropharynx non-erythematous. No PTA or exudate.  Neck: No stridor.  Cardiovascular: Normal rate, regular rhythm. Good peripheral circulation. Grossly normal heart sounds.   Respiratory: Normal respiratory effort.  No retractions. Lungs CTAB. Gastrointestinal: No distention.  Musculoskeletal:  No gross deformities of extremities. Neurologic:  Normal speech and language.  Skin:  Skin is warm, dry and intact. No rash noted.  ____________________________________________   LABS (all labs ordered are listed, but only abnormal results are  displayed)  Labs Reviewed  RESP PANEL BY RT-PCR (FLU A&B, COVID) [id number]   ____________________________________________  RADIOLOGY  DG Chest Port 1 View  Result Date: 01/21/2021 CLINICAL DATA:  Cough EXAM: PORTABLE CHEST 1 VIEW COMPARISON:  07/26/2020 FINDINGS: The heart size and mediastinal contours are within normal limits. Both lungs are clear. The visualized skeletal structures are unremarkable.  IMPRESSION: Negative. Electronically Signed   By: Charlett Nose M.D.   On: 01/21/2021 20:59    ____________________________________________   PROCEDURES  Procedure(s) performed:   Procedures  None ____________________________________________   INITIAL IMPRESSION / ASSESSMENT AND PLAN / ED COURSE  Pertinent labs & imaging results that were available during my care of the patient were reviewed by me and considered in my medical decision making (see chart for details).   Patient presents to the ED with cough and congestion symptoms. CXR obtained to r/o PNA. No infiltrate on CXR. Suspect viral process. Advised to continue abx as prescribed previously. I have not have the strep culture information for review. Plan for albuterol inh to use PRN for cough "fits" as described by patient. No wheezing on exam but question some bronchospastic cough. Vitals are WNL. COVID and Flu are negative. Discussed plan for close PCP follow up.    ____________________________________________  FINAL CLINICAL IMPRESSION(S) / ED DIAGNOSES  Final diagnoses:  Viral URI with cough    NEW OUTPATIENT MEDICATIONS STARTED DURING THIS VISIT:  Discharge Medication List as of 01/21/2021  9:55 PM    START taking these medications   Details  albuterol (VENTOLIN HFA) 108 (90 Base) MCG/ACT inhaler Inhale 1-2 puffs into the lungs every 6 (six) hours as needed for wheezing or shortness of breath., Starting Sun 01/21/2021, Print        Note:  This document was prepared using Dragon voice recognition software and may include unintentional dictation errors.  Alona Bene, MD, [facility]    Swayze Kozuch, Arlyss Repress, MD 01/22/21 Ebony Cargo

## 2021-02-05 ENCOUNTER — Encounter: Payer: Self-pay | Admitting: Neurology

## 2021-02-05 ENCOUNTER — Ambulatory Visit (INDEPENDENT_AMBULATORY_CARE_PROVIDER_SITE_OTHER): Payer: 59 | Admitting: Neurology

## 2021-02-05 VITALS — BP 120/82 | HR 67 | Ht 66.0 in | Wt 214.0 lb

## 2021-02-05 DIAGNOSIS — G43009 Migraine without aura, not intractable, without status migrainosus: Secondary | ICD-10-CM

## 2021-02-05 NOTE — Patient Instructions (Signed)
Let me know if you decide to pursue medications for migraine prevention  Referral to psych Recommend establishing with primary care  See you back as needed

## 2021-02-05 NOTE — Progress Notes (Signed)
PATIENT: Maria Sutton DOB: 1996-07-10  REASON FOR VISIT: follow up HISTORY FROM: patient  HISTORY OF PRESENT ILLNESS: Today 02/05/21 Ms. Maria Sutton is a 25 year old female with a history of migraine headaches.  MRI of the brain was unremarkable.  Has not benefited from Topamax or Trokendi, reports caused suicide ideation.  Dr. Anne Hahn started her on Effexor. She never took it. Claims can't take antidepressants due to SI. Migraines happen when she "uses her brain", ex: reading a book, watching a TV show "Criminal Minds". When in high school or college, doing the school work would trigger a migraine. Sometimes trigger dizziness. Not in school, last job working in [employer], headaches triggered, she quit. She has Lupus/RA. 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 Too much physical activity, triggers joint pain. Reports normal eye exam, no glasses or contact.  Was planning to see a therapist about autism diagnosis. Claims if she was working or in school, would trigger every other day migraine.  Here today for evaluation unaccompanied.  HISTORY  11/07/2020 Dr. Anne Hahn: Ms. Maria Sutton is a 25 year old right-handed Hispanic female with a history of migraine type headaches that have been an issue for her since she was in high school.  She indicates that when she tries to concentrate on something, this tends to activate her headaches.  Watching TV or looking at her cell phone or looking at a computer is also an activator.  She stopped going to college because of the headaches in part 1 year ago.  Being out of school has helped the headaches some but the headaches still are occurring 2-3 times a week.  The patient may get dizziness that may be associated with vertigo with the headache, but she denies any nausea or vomiting.  She does note photophobia and phonophobia.  She reports no numbness or weakness of the extremities or face associated with the headache.  At times, she may have pain that is severe  enough that she has near syncope.  She does note some neck stiffness.  She denies any excessive caffeine intake and she denies a significant family history for headache.  In the past she has been placed on Topamax and then Trokendi without much benefit, she took Imitrex without benefit.  She switched to CBD oil 1 year ago with some benefit initially but the headaches are now coming back.  She is a taking Excedrin Migraine if needed without benefit.  She is sent to this office for further evaluation.  REVIEW OF SYSTEMS: Out of a complete 14 system review of symptoms, the patient complains only of the following symptoms, and all other reviewed systems are negative.  Headache   ALLERGIES: Allergies  Allergen Reactions  . Other     "alcohol"- states gets aches and abdominal discomfort    HOME MEDICATIONS: Outpatient Medications Prior to Visit  Medication Sig Dispense Refill  . albuterol (VENTOLIN HFA) 108 (90 Base) MCG/ACT inhaler Inhale 1-2 puffs into the lungs every 6 (six) hours as needed for wheezing or shortness of breath. 6.7 g 0  . hydroxychloroquine (PLAQUENIL) 200 MG tablet Take 1 tablet (200 mg total) by mouth daily. 90 tablet 1  . ALPRAZolam (XANAX) 0.5 MG tablet Take 2 tablets approximately 45 minutes prior to the MRI study, take a third tablet if needed. 3 tablet 0  . benzonatate (TESSALON) 200 MG capsule Take 1 capsule (200 mg total) by mouth 3 (three) times daily as needed for cough. 30 capsule 0  . brompheniramine-pseudoephedrine-DM  30-2-10 MG/5ML syrup Take 5 mLs by mouth 4 (four) times daily as needed. 120 mL 0  . Cholecalciferol (VITAMIN D3 PO) Take 1,000 Units by mouth. Takes 5000 iu    . diclofenac (CATAFLAM) 50 MG tablet Take 1 tablet (50 mg total) by mouth 3 (three) times daily as needed. 20 tablet 1  . fluticasone (FLONASE) 50 MCG/ACT nasal spray Place 2 sprays into both nostrils daily. 16 g 0  . levocetirizine (XYZAL) 5 MG tablet Take 1 tablet (5 mg total) by mouth every  evening. 90 tablet 0  . venlafaxine XR (EFFEXOR XR) 37.5 MG 24 hr capsule One tablet daily for 2 weeks, then take 2 a day 60 capsule 2   No facility-administered medications prior to visit.    PAST MEDICAL HISTORY: Past Medical History:  Diagnosis Date  . ADHD   . Anxiety   . Depression   . Depression    Phreesia 08/07/2020  . Headache   . Lupus (HCC)   . PTSD (post-traumatic stress disorder)   . Rheumatoid arthritis (HCC)   . Thyroid disease    hypothyroid    PAST SURGICAL HISTORY: No past surgical history on file.  FAMILY HISTORY: Family History  Problem Relation Age of Onset  . Hypertension Mother   . Cancer Father     SOCIAL HISTORY: Social History   Socioeconomic History  . Marital status: Single    Spouse name: Not on file  . Number of children: Not on file  . Years of education: Not on file  . Highest education level: Not on file  Occupational History    Comment: not working  Tobacco Use  . Smoking status: Never Smoker  . Smokeless tobacco: Never Used  Vaping Use  . Vaping Use: Never used  Substance and Sexual Activity  . Alcohol use: Not Currently  . Drug use: Yes    Comment: CBD  . Sexual activity: Not on file  Other Topics Concern  . Not on file  Social History Narrative   Lives with mom and dad   Right handed   Drinks 1 cup caffeine daily   Social Determinants of Health   Financial Resource Strain: Not on file  Food Insecurity: Not on file  Transportation Needs: Not on file  Physical Activity: Not on file  Stress: Not on file  Social Connections: Not on file  Intimate Partner Violence: Not on file      PHYSICAL EXAM  Vitals:   02/05/21 0849  BP: 120/82  Pulse: 67  Weight: 214 lb (97.1 kg)  Height: 5\' 6"  (1.676 m)   Body mass index is 34.54 kg/m.  Generalized: Well developed, in no acute distress   Neurological examination  Mentation: Alert oriented to time, place, history taking. Follows all commands speech and  language fluent Cranial nerve II-XII: Pupils were equal round reactive to light. Extraocular movements were full, visual field were full on confrontational test. Facial sensation and strength were normal.  Head turning and shoulder shrug  were normal and symmetric. Motor: The motor testing reveals 5 over 5 strength of all 4 extremities. Good symmetric motor tone is noted throughout.  Sensory: Sensory testing is intact to soft touch on all 4 extremities. No evidence of extinction is noted.  Coordination: Cerebellar testing reveals good finger-nose-finger and heel-to-shin bilaterally.  Gait and station: Gait is normal. Tandem gait is normal.  Reflexes: Deep tendon reflexes are symmetric and normal bilaterally.   DIAGNOSTIC DATA (LABS, IMAGING, TESTING) - I reviewed  patient records, labs, notes, testing and imaging myself where available.  Lab Results  Component Value Date   WBC 9.8 01/15/2021   HGB 15.8 (H) 01/15/2021   HCT 46.9 (H) 01/15/2021   MCV 83.3 01/15/2021   PLT 303 01/15/2021      Component Value Date/Time   NA 137 01/15/2021 2256   NA 140 08/10/2020 1537   K 3.8 01/15/2021 2256   CL 101 01/15/2021 2256   CO2 27 01/15/2021 2256   GLUCOSE 88 01/15/2021 2256   BUN 10 01/15/2021 2256   BUN 6 08/10/2020 1537   CREATININE 0.60 01/15/2021 2256   CALCIUM 9.2 01/15/2021 2256   PROT 8.2 (H) 01/15/2021 2256   PROT 7.7 08/10/2020 1537   ALBUMIN 4.6 01/15/2021 2256   ALBUMIN 5.0 08/10/2020 1537   AST 21 01/15/2021 2256   ALT 33 01/15/2021 2256   ALKPHOS 63 01/15/2021 2256   BILITOT 0.5 01/15/2021 2256   BILITOT 0.6 08/10/2020 1537   GFRNONAA >60 01/15/2021 2256   GFRAA 137 08/10/2020 1537   Lab Results  Component Value Date   CHOL 202 (H) 08/10/2020   HDL 48 08/10/2020   LDLCALC 137 (H) 08/10/2020   TRIG 92 08/10/2020   CHOLHDL 4.2 08/10/2020   Lab Results  Component Value Date   HGBA1C 5.2 08/10/2020   No results found for: VITAMINB12 Lab Results  Component Value  Date   TSH 0.779 08/10/2020   ASSESSMENT AND PLAN 24 y.o. year old female  has a past medical history of ADHD, Anxiety, Depression, Depression, Headache, Lupus (HCC), PTSD (post-traumatic stress disorder), Rheumatoid arthritis (HCC), and Thyroid disease. here with:  1.  Intractable migraine headache  -Long discussion about treatment options, Topamax resulted in suicidal ideation, never tried Effexor due to history of antidepressants causing suicidal ideation -I feel CGRP would be the best option, given her underlying mental health issues, however she does not wish to proceed with any prescription medications at this point -I will refer her to psychiatry, she mentions needing a counselor -MRI of the brain with and without contrast was unremarkable -If she wishes to pursue medication for her migraines, she will let me know -Continue follow-up with PCP, follow-up at our office on an as-needed basis  Otila Kluver, DNP 02/05/2021, 8:52 AM [facility] [address], [address] [place], [place] [address] [phone]

## 2021-02-06 NOTE — Progress Notes (Signed)
I have read the note, and I agree with the clinical assessment and plan.  Stephane Junkins K Mikaia Janvier   

## 2021-04-13 ENCOUNTER — Telehealth: Payer: Self-pay | Admitting: Internal Medicine

## 2021-04-13 NOTE — Telephone Encounter (Signed)
Patient calling in reference to a new job she has taken at the [employer]. Patient is required to receive the Rabies vaccine.  Patient calling to make sure she is okay to have the series of two vaccines. Please call to advise.

## 2021-04-13 NOTE — Telephone Encounter (Signed)
Spoke with patient to clarify this is for routine prevention (not after a known exposure to rabies), so advised patient no adjustment is needed for her medication or the vaccine.

## 2021-04-13 NOTE — Telephone Encounter (Signed)
If this is for routine prevention (not after a known exposure to rabies) no adjustment is needed for her medication or the vaccine.

## 2021-04-23 ENCOUNTER — Ambulatory Visit (INDEPENDENT_AMBULATORY_CARE_PROVIDER_SITE_OTHER): Payer: 59 | Admitting: Internal Medicine

## 2021-04-23 ENCOUNTER — Encounter: Payer: Self-pay | Admitting: Internal Medicine

## 2021-04-23 ENCOUNTER — Other Ambulatory Visit: Payer: Self-pay

## 2021-04-23 VITALS — BP 124/79 | HR 83 | Ht 67.0 in | Wt 218.0 lb

## 2021-04-23 DIAGNOSIS — M0579 Rheumatoid arthritis with rheumatoid factor of multiple sites without organ or systems involvement: Secondary | ICD-10-CM

## 2021-04-23 NOTE — Progress Notes (Signed)
Office Visit Note  Patient: Maria Sutton             Date of Birth: 15-Aug-1996           MRN: [id number]             PCP: Pcp, No Referring: Just, Azalee Course, FNP Visit Date: 04/23/2021   Subjective:  Follow-up (Patient is doing well on PLQ 200 mg daily. Per patient- PLQ eye exam 02/2021 w/ [facility], we will ask for this record.)   History of Present Illness: Maria Sutton is a 25 y.o. female here for follow up for seropositive RA on hydroxychloroquine 200 mg PO daily.  Since her last visit she has not noticed any major flareup of joint pain stiffness or swelling.  She not had any more problems since lowering the hydroxychloroquine to the 1 tablet daily dose.  She stopped taking the medicine for a few periods of time I think she noticed some increased joint pains particularly of the shoulders this improved again.  She was seen at the ED for viral upper respiratory infection a few times in April but no other significant infections.   10/25/2020 Maria Sutton is a 25 y.o. 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 She decreased her dose of medication from 400mg  to 200mg  due to intolerance and noticed problems such as sleep disturbance, jaw pain, eye twitching she was not sure which related to the medication or underlying issues. She feels that symptoms are partially improved with this, particularly TMJ pain also her right arm pain is improved since last visit and has not had a repeat episode of sharp chest pain. She does continue to have headaches and fatigue and right eyebrow twitching is worse.   DMARD Hx HCQ 08/2020-current   Labs 08/2020 ANA 1:320 speckled RF 42 CCP >250 SSA >8.0   08/2020 Vitamin D 26.7   Review of Systems  Constitutional:  Positive for fatigue.  HENT:  Negative for mouth sores, mouth dryness and nose dryness.   Eyes:  Negative for pain, itching, visual disturbance and  dryness.  Respiratory:  Negative for cough, hemoptysis, shortness of breath and difficulty breathing.   Cardiovascular:  Negative for chest pain, palpitations and swelling in legs/feet.  Gastrointestinal:  Negative for abdominal pain, blood in stool, constipation and diarrhea.  Endocrine: Negative for increased urination.  Genitourinary:  Negative for painful urination.  Musculoskeletal:  Negative for joint pain, joint pain, joint swelling, myalgias, muscle weakness, morning stiffness, muscle tenderness and myalgias.  Skin:  Negative for color change, pallor, hair loss and nodules/bumps.  Allergic/Immunologic: Negative for susceptible to infections.  Neurological:  Positive for dizziness, headaches and memory loss. Negative for numbness and weakness.  Hematological:  Negative for swollen glands.  Psychiatric/Behavioral:  Positive for confusion. Negative for sleep disturbance.    PMFS History:  Patient Active Problem List   Diagnosis Date Noted   Rheumatoid arthritis with rheumatoid factor of multiple sites without organ or systems involvement (HCC) 08/28/2020   Positive ANA (antinuclear antibody) 08/22/2020   Vitamin D deficiency 03/19/2019   Obesity with body mass index (BMI) of 30.0 to 39.9 03/19/2019   Reactive depression 09/25/2018   Migraine without aura and without status migrainosus, not intractable 09/25/2018   Cognitive complaints 09/25/2018    Past Medical History:  Diagnosis Date   ADHD    Anxiety    Depression    Depression  Phreesia 08/07/2020   Headache    Lupus (HCC)    PTSD (post-traumatic stress disorder)    Rheumatoid arthritis (HCC)    Thyroid disease    hypothyroid    Family History  Problem Relation Age of Onset   Hypertension Mother    Cancer Father    History reviewed. No pertinent surgical history. Social History   Social History Narrative   Lives with mom and dad   Right handed   Drinks 1 cup caffeine daily    There is no immunization  history on file for this patient.   Objective: Vital Signs: BP 124/79 (BP Location: Left Arm, Patient Position: Sitting, Cuff Size: Normal)   Pulse 83   Ht 5\' 7"  (1.702 m)   Wt 218 lb (98.9 kg)   BMI 34.14 kg/m    Physical Exam Eyes:     Conjunctiva/sclera: Conjunctivae normal.  Skin:    General: Skin is warm and dry.     Findings: No rash.  Neurological:     General: No focal deficit present.     Mental Status: She is alert.  Psychiatric:        Mood and Affect: Mood normal.     Musculoskeletal Exam:  Shoulders full ROM no tenderness or swelling Elbows full ROM no tenderness or swelling Wrists full ROM no tenderness or swelling Fingers full ROM no tenderness or swelling Knees full ROM no tenderness or swelling   CDAI Exam: CDAI Score: 1  Patient Global: 10 mm; Provider Global: 0 mm Swollen: 0 ; Tender: 0  Joint Exam 04/23/2021   All documented joints were normal     Investigation: No additional findings.  Imaging: No results found.  Recent Labs: Lab Results  Component Value Date   WBC 9.8 01/15/2021   HGB 15.8 (H) 01/15/2021   PLT 303 01/15/2021   NA 137 01/15/2021   K 3.8 01/15/2021   CL 101 01/15/2021   CO2 27 01/15/2021   GLUCOSE 88 01/15/2021   BUN 10 01/15/2021   CREATININE 0.60 01/15/2021   BILITOT 0.5 01/15/2021   ALKPHOS 63 01/15/2021   AST 21 01/15/2021   ALT 33 01/15/2021   PROT 8.2 (H) 01/15/2021   ALBUMIN 4.6 01/15/2021   CALCIUM 9.2 01/15/2021   GFRAA 137 08/10/2020    Speciality Comments: PLQ Eye Exam- [facility] 02/2021  Procedures:  No procedures performed Allergies: Other   Assessment / Plan:     Visit Diagnoses: Rheumatoid arthritis with rheumatoid factor of multiple sites without organ or systems involvement (HCC)  Seropositive rheumatoid arthritis currently in low disease activity with no active inflammation on exam today.  We will plan to continue hydroxychloroquine 200 mg daily.  She reports recent previous eye  exam 2 visits with Dr. 03/2021 with no retinal problem identified.  We will request a copy of these report to review.  She has recent labs from April without significant cytopenias or change in renal function.  She had question about work related vaccinations and hydroxychloroquine there is no evidence for interaction or problem here.  We will plan to follow-up next year if continuing to do well.  Orders: No orders of the defined types were placed in this encounter.  No orders of the defined types were placed in this encounter.    Follow-Up Instructions: Return in about 1 year (around 04/23/2022) for RA on HCQ f/u 6mos.   14mo, MD  Note - This record has been created using [employer] Plan.  Chart creation errors have been sought, but may not always  have been located. Such creation errors do not reflect on  the standard of medical care.

## 2021-04-24 ENCOUNTER — Ambulatory Visit: Payer: 59 | Admitting: Internal Medicine

## 2021-05-01 ENCOUNTER — Telehealth: Payer: Self-pay | Admitting: Neurology

## 2021-05-01 NOTE — Telephone Encounter (Signed)
I spoke to the patient. 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 Per the patient, they are requiring a fit-for-duty form to be completed stating there is no contraindication with her holding a CDL w/ her diagnosis of migraine. She is going to drop the form off to our office for review. She is aware there is a $50 paperwork fee.

## 2021-05-01 NOTE — Telephone Encounter (Signed)
Pt called, employer, [employer] requiring a fit for duty form to be filled out for migraines. Would like a call  from the nurse.

## 2021-05-07 NOTE — Telephone Encounter (Signed)
Dr Anne Hahn completed [facility] Employee Health and Wellness form. Last office note and forme sent to medical records for processing.

## 2021-06-25 ENCOUNTER — Encounter (HOSPITAL_BASED_OUTPATIENT_CLINIC_OR_DEPARTMENT_OTHER): Payer: Self-pay | Admitting: Emergency Medicine

## 2021-06-25 ENCOUNTER — Other Ambulatory Visit: Payer: Self-pay

## 2021-06-25 ENCOUNTER — Emergency Department (HOSPITAL_BASED_OUTPATIENT_CLINIC_OR_DEPARTMENT_OTHER): Payer: 59

## 2021-06-25 ENCOUNTER — Emergency Department (HOSPITAL_BASED_OUTPATIENT_CLINIC_OR_DEPARTMENT_OTHER)
Admission: EM | Admit: 2021-06-25 | Discharge: 2021-06-25 | Disposition: A | Discharge status: Home / Self Care | Payer: 59 | Attending: Emergency Medicine | Admitting: Emergency Medicine

## 2021-06-25 ENCOUNTER — Encounter: Payer: Self-pay | Admitting: Obstetrics and Gynecology

## 2021-06-25 ENCOUNTER — Ambulatory Visit (INDEPENDENT_AMBULATORY_CARE_PROVIDER_SITE_OTHER): Payer: 59 | Admitting: Obstetrics and Gynecology

## 2021-06-25 ENCOUNTER — Other Ambulatory Visit (HOSPITAL_COMMUNITY)
Admission: RE | Admit: 2021-06-25 | Discharge: 2021-06-25 | Disposition: A | Discharge status: Home / Self Care | Payer: 59 | Source: Ambulatory Visit | Attending: Obstetrics and Gynecology | Admitting: Obstetrics and Gynecology

## 2021-06-25 VITALS — BP 125/83 | HR 80 | Wt 229.0 lb

## 2021-06-25 DIAGNOSIS — Z8639 Personal history of other endocrine, nutritional and metabolic disease: Secondary | ICD-10-CM

## 2021-06-25 DIAGNOSIS — R1031 Right lower quadrant pain: Secondary | ICD-10-CM

## 2021-06-25 DIAGNOSIS — E039 Hypothyroidism, unspecified: Secondary | ICD-10-CM | POA: Diagnosis not present

## 2021-06-25 DIAGNOSIS — Z202 Contact with and (suspected) exposure to infections with a predominantly sexual mode of transmission: Secondary | ICD-10-CM

## 2021-06-25 LAB — CBC
HCT: 42.2 % (ref 36.0–46.0)
Hemoglobin: 14.5 g/dL (ref 12.0–15.0)
MCH: 28 pg (ref 26.0–34.0)
MCHC: 34.4 g/dL (ref 30.0–36.0)
MCV: 81.5 fL (ref 80.0–100.0)
Platelets: 274 10*3/uL (ref 150–400)
RBC: 5.18 MIL/uL — ABNORMAL HIGH (ref 3.87–5.11)
RDW: 12.1 % (ref 11.5–15.5)
WBC: 8.3 10*3/uL (ref 4.0–10.5)
nRBC: 0 % (ref 0.0–0.2)

## 2021-06-25 LAB — URINALYSIS, ROUTINE W REFLEX MICROSCOPIC
Bilirubin Urine: NEGATIVE
Glucose, UA: NEGATIVE mg/dL
Hgb urine dipstick: NEGATIVE
Ketones, ur: NEGATIVE mg/dL
Leukocytes,Ua: NEGATIVE
Nitrite: NEGATIVE
Protein, ur: NEGATIVE mg/dL
Specific Gravity, Urine: 1.02 (ref 1.005–1.030)
pH: 6 (ref 5.0–8.0)

## 2021-06-25 LAB — COMPREHENSIVE METABOLIC PANEL
ALT: 13 U/L (ref 0–44)
AST: 13 U/L — ABNORMAL LOW (ref 15–41)
Albumin: 4.1 g/dL (ref 3.5–5.0)
Alkaline Phosphatase: 63 U/L (ref 38–126)
Anion gap: 8 (ref 5–15)
BUN: 9 mg/dL (ref 6–20)
CO2: 25 mmol/L (ref 22–32)
Calcium: 8.8 mg/dL — ABNORMAL LOW (ref 8.9–10.3)
Chloride: 104 mmol/L (ref 98–111)
Creatinine, Ser: 0.69 mg/dL (ref 0.44–1.00)
GFR, Estimated: >60 mL/min (ref >60)
Glucose, Bld: 97 mg/dL (ref 70–99)
Potassium: 3.8 mmol/L (ref 3.5–5.1)
Sodium: 137 mmol/L (ref 135–145)
Total Bilirubin: 0.5 mg/dL (ref 0.3–1.2)
Total Protein: 7.3 g/dL (ref 6.5–8.1)

## 2021-06-25 LAB — LIPASE, BLOOD: Lipase: 26 U/L (ref 11–51)

## 2021-06-25 LAB — PREGNANCY, URINE: Preg Test, Ur: NEGATIVE

## 2021-06-25 MED ORDER — IOHEXOL 350 MG/ML SOLN
100.0000 mL | Freq: Once | INTRAVENOUS | Status: AC | PRN
  Administered 2021-06-25: 100 mL via INTRAVENOUS

## 2021-06-25 NOTE — Discharge Instructions (Addendum)
Follow-up with your GYN today as scheduled, and return to the ER if symptoms significantly worsen or change.

## 2021-06-25 NOTE — Progress Notes (Signed)
25 yo P0 with LMP 06/20/21 and BMI 35 presenting today for the evaluation of RLQ pain. Patient reports a history of free fluid a few months ago in her pelvis. She states that since that diagnosis she experiences dull RLQ pain with activities. She also reports skipping a month of her cycle. She admits to never being regular. She was diagnosed with hypothyroidism and never treated. She believes she was later seen by an endocrinologist and was told all her values were normal. Patient admits to gaining 10 lbs without changing her diet or exercise regimen. Patient is sexually active using condoms for contraception. She is without any other complaints. Patient denies female pattern hair distribution or facial hair  Past Medical History:  Diagnosis Date   ADHD    Anxiety    Depression    Depression    Phreesia 08/07/2020   Headache    Lupus (HCC)    PTSD (post-traumatic stress disorder)    Rheumatoid arthritis (HCC)    Thyroid disease    hypothyroid   History reviewed. No pertinent surgical history. Family History  Problem Relation Age of Onset   Hypertension Mother    Cancer Father    ROS See pertinent in HPI. All other systems reviewed and non contributory Blood pressure 125/83, pulse 80, weight 229 lb (103.9 kg), last menstrual period 06/20/2021. GENERAL: Well-developed, well-nourished female in no acute distress.  ABDOMEN: Soft, nontender, nondistended. No organomegaly. PELVIC: Normal external female genitalia. Vagina is pink and rugated.  Normal discharge. Normal appearing cervix. Uterus is normal in size. No adnexal mass or tenderness. Chaperone present during the pelvic exam EXTREMITIES: No cyanosis, clubbing, or edema, 2+ distal pulses.  CT ABDOMEN PELVIS W CONTRAST  Result Date: 06/25/2021 CLINICAL DATA:  Abdominal abscess and infection is suspected. Right lower quadrant pain. EXAM: CT ABDOMEN AND PELVIS WITH CONTRAST TECHNIQUE: Multidetector CT imaging of the abdomen and pelvis was  performed using the standard protocol following bolus administration of intravenous contrast. CONTRAST:  OMNIPAQUE IOHEXOL 350 MG/ML SOLN COMPARISON:  None. FINDINGS: Lower chest: Mild dependent changes in the lung bases. Hepatobiliary: No focal liver abnormality is seen. No gallstones, gallbladder wall thickening, or biliary dilatation. Pancreas: Unremarkable. No pancreatic ductal dilatation or surrounding inflammatory changes. Spleen: Normal in size without focal abnormality. Adrenals/Urinary Tract: Adrenal glands are unremarkable. Kidneys are normal, without renal calculi, focal lesion, or hydronephrosis. Bladder is unremarkable. Stomach/Bowel: Stomach, small bowel, and colon are not abnormally distended. No wall thickening or inflammatory changes. Appendix is normal. No mesenteric collections. Vascular/Lymphatic: No significant vascular findings are present. No enlarged abdominal or pelvic lymph nodes. Reproductive: Uterus and bilateral adnexa are unremarkable. Other: No free air or free fluid in the abdomen. Abdominal wall musculature appears intact. Musculoskeletal: No acute or significant osseous findings. IMPRESSION: No acute abnormalities demonstrated in the abdomen or pelvis. No evidence of bowel obstruction or inflammation. Appendix is normal. Electronically Signed   By: Burman Nieves M.D.   On: 06/25/2021 03:15    A/P 25 yo with RLQ pain and irregular cycles - Reassurance provided regarding free fluid previously seen as it was likely related to recent ovulation - Reviewed CT scan from earlier today - Discussed hormonal contraception for pregnancy prevention and to suppress ovulation- Patient desires to delay for now - STI screening and TSH today - Patient will be contacted with abnormal results -Patient with normal pap smear 08/2020

## 2021-06-25 NOTE — Progress Notes (Signed)
Patient is here to discuss her lower abdominal pain.Last pap 08/15/2020- negative  STD testing today

## 2021-06-25 NOTE — ED Provider Notes (Signed)
[facility] EMERGENCY DEPARTMENT Provider Note   CSN: [id number] Arrival date & time: 06/25/21  0114     History Chief Complaint  Patient presents with   Abdominal Pain    Maria Sutton is a 25 y.o. female.  Patient is a 25 year old female with past medical history of ADHD, anxiety, depression, lupus, PTSD.  Patient presenting today with complaints of right lower quadrant pain.  This began earlier this morning and has worsened throughout the day.  Pain is worse when she reaches, bends, or moves.  She denies any urinary complaints.  She reports having 2 bowel movements today with no diarrhea or blood.  The history is provided by the patient.  Abdominal Pain Pain location:  RLQ Pain quality: stabbing   Pain radiates to:  Does not radiate Pain severity:  Moderate Onset quality:  Sudden Duration:  12 hours Timing:  Constant Progression:  Worsening Chronicity:  New     Past Medical History:  Diagnosis Date   ADHD    Anxiety    Depression    Depression    Phreesia 08/07/2020   Headache    Lupus (HCC)    PTSD (post-traumatic stress disorder)    Rheumatoid arthritis (HCC)    Thyroid disease    hypothyroid    Patient Active Problem List   Diagnosis Date Noted   Rheumatoid arthritis with rheumatoid factor of multiple sites without organ or systems involvement (HCC) 08/28/2020   Positive ANA (antinuclear antibody) 08/22/2020   Vitamin D deficiency 03/19/2019   Obesity with body mass index (BMI) of 30.0 to 39.9 03/19/2019   Reactive depression 09/25/2018   Migraine without aura and without status migrainosus, not intractable 09/25/2018   Cognitive complaints 09/25/2018    History reviewed. No pertinent surgical history.   OB History   No obstetric history on file.     Family History  Problem Relation Age of Onset   Hypertension Mother    Cancer Father     Social History   Tobacco Use   Smoking status: Never   Smokeless tobacco: Never  Vaping  Use   Vaping Use: Never used  Substance Use Topics   Alcohol use: Not Currently   Drug use: Not Currently    Home Medications Prior to Admission medications   Medication Sig Start Date End Date Taking? Authorizing Provider  albuterol (VENTOLIN HFA) 108 (90 Base) MCG/ACT inhaler Inhale 1-2 puffs into the lungs every 6 (six) hours as needed for wheezing or shortness of breath. Patient not taking: Reported on 04/23/2021 01/21/21   Long, Arlyss Repress, MD  cholecalciferol (VITAMIN D3) 25 MCG (1000 UNIT) tablet Take 1,000 Units by mouth daily.    [provider]    Allergies    Other  Review of Systems   Review of Systems  Gastrointestinal:  Positive for abdominal pain.  All other systems reviewed and are negative.  Physical Exam Updated Vital Signs BP 119/86 (BP Location: Right Arm)   Pulse 80   Temp 98.3 F (36.8 C) (Oral)   Resp 18   LMP 06/20/2021 (Exact Date)   SpO2 100%   Physical Exam Vitals and nursing note reviewed.  Constitutional:      General: She is not in acute distress.    Appearance: She is well-developed. She is not diaphoretic.  HENT:     Head: Normocephalic and atraumatic.  Cardiovascular:     Rate and Rhythm: Normal rate and regular rhythm.     Heart sounds: No murmur  heard.   No friction rub. No gallop.  Pulmonary:     Effort: Pulmonary effort is normal. No respiratory distress.     Breath sounds: Normal breath sounds. No wheezing.  Abdominal:     General: Bowel sounds are normal. There is no distension.     Palpations: Abdomen is soft.     Tenderness: There is abdominal tenderness in the right lower quadrant. There is no right CVA tenderness, left CVA tenderness, guarding or rebound.  Musculoskeletal:        General: Normal range of motion.     Cervical back: Normal range of motion and neck supple.  Skin:    General: Skin is warm and dry.  Neurological:     General: No focal deficit present.     Mental Status: She is alert and oriented to  person, place, and time.    ED Results / Procedures / Treatments   Labs (all labs ordered are listed, but only abnormal results are displayed) Labs Reviewed  LIPASE, BLOOD  COMPREHENSIVE METABOLIC PANEL  CBC  URINALYSIS, ROUTINE W REFLEX MICROSCOPIC  PREGNANCY, URINE    EKG None  Radiology No results found.  Procedures Procedures   Medications Ordered in ED Medications - No data to display  ED Course  I have reviewed the triage vital signs and the nursing notes.  Pertinent labs & imaging results that were available during my care of the patient were reviewed by me and considered in my medical decision making (see chart for details).    MDM Rules/Calculators/A&P  Patient presenting with complaints of right lower quadrant pain that started earlier today.  Patient arrives here afebrile with no white count.  She does have some tenderness in the right lower quadrant, but no peritoneal signs.  CT scan is negative for appendicitis or other acute intra-abdominal process.  Patient states she has had free fluid in her pelvis in the past, however I see no comment of this in the CT scan.  She has an upcoming appointment this morning with her GYN and I have advised her to keep this appointment to discuss her concerns.  At this point, discharge seems appropriate with outpatient follow-up.  Final Clinical Impression(s) / ED Diagnoses Final diagnoses:  None    Rx / DC Orders ED Discharge Orders     None        Geoffery Lyons, MD 06/25/21 [phone]

## 2021-06-25 NOTE — ED Triage Notes (Signed)
Pt c/o RLQ abdominal pain that began this morning. Describes pain as sharp and constant. Pt passing gas and last BM today. Denies N/V, fevers.

## 2021-06-26 LAB — CERVICOVAGINAL ANCILLARY ONLY
Bacterial Vaginitis (gardnerella): NEGATIVE
Candida Glabrata: NEGATIVE
Candida Vaginitis: NEGATIVE
Chlamydia: NEGATIVE
Comment: NEGATIVE
Comment: NEGATIVE
Comment: NEGATIVE
Comment: NEGATIVE
Comment: NEGATIVE
Comment: NORMAL
Neisseria Gonorrhea: NEGATIVE
Trichomonas: NEGATIVE

## 2021-06-26 LAB — THYROID PANEL WITH TSH
Free Thyroxine Index: 2.5 (ref 1.2–4.9)
T3 Uptake Ratio: 25 % (ref 24–39)
T4, Total: 10.1 ug/dL (ref 4.5–12.0)
TSH: 0.749 u[IU]/mL (ref 0.450–4.500)

## 2021-07-09 ENCOUNTER — Other Ambulatory Visit: Payer: Self-pay

## 2021-07-09 ENCOUNTER — Ambulatory Visit (INDEPENDENT_AMBULATORY_CARE_PROVIDER_SITE_OTHER): Payer: 59 | Admitting: Obstetrics and Gynecology

## 2021-07-09 ENCOUNTER — Encounter: Payer: Self-pay | Admitting: Obstetrics and Gynecology

## 2021-07-09 VITALS — BP 115/81 | HR 69 | Wt 220.0 lb

## 2021-07-09 DIAGNOSIS — Z3009 Encounter for other general counseling and advice on contraception: Secondary | ICD-10-CM

## 2021-07-09 MED ORDER — SLYND 4 MG PO TABS: 1.0000 | ORAL_TABLET | Freq: Every day | ORAL | 12 refills | Status: DC

## 2021-07-09 NOTE — Progress Notes (Signed)
Pt here to discuss BC options, interested in pill.   Pt states she is having irregular cycles.  Pt states she is also having "gas like" bubbles and can hear audible sounds. Would like to know if could be ovary/cyst related.

## 2021-07-09 NOTE — Progress Notes (Signed)
25 yo P0 here for contraception counseling. Patient is interested in pills. She is being followed by a neurologist for migraine headaches. Patient is sexually active using condoms. Patient is without any other complaints  Past Medical History:  Diagnosis Date   ADHD    Anxiety    Depression    Depression    Phreesia 08/07/2020   Headache    Lupus (HCC)    PTSD (post-traumatic stress disorder)    Rheumatoid arthritis (HCC)    Thyroid disease    hypothyroid   History reviewed. No pertinent surgical history. Family History  Problem Relation Age of Onset   Hypertension Mother    Cancer Father    Social History   Tobacco Use   Smoking status: Never   Smokeless tobacco: Never  Vaping Use   Vaping Use: Never used  Substance Use Topics   Alcohol use: Not Currently   Drug use: Not Currently   ROS See pertinent in HPI. All other systems reviewed and non contributory Blood pressure 115/81, pulse 69, weight 220 lb (99.8 kg), last menstrual period 07/05/2021. GENERAL: Well-developed, well-nourished female in no acute distress.  NEURO: alert and oriented x 3  A/P 25 yo here for contraception options - Options reviewed with the patient. Given that patient is being followed by a neurologist for headaches, opted with POP - Rx Slynd provided - Discussed safe sex practices and patient is using condoms with every sexual encounter

## 2021-09-03 ENCOUNTER — Telehealth: Payer: Self-pay

## 2021-09-03 ENCOUNTER — Encounter: Payer: Self-pay | Admitting: Internal Medicine

## 2021-09-03 ENCOUNTER — Telehealth: Payer: Self-pay | Admitting: Internal Medicine

## 2021-09-03 NOTE — Telephone Encounter (Signed)
Patient left a voicemail (Friday, 08/31/21)  stating her right shoulder has been inflamed for the past 2 days.  Patient states she has been taking the Plaquenil, but doesn't feel like it is helping.  Patient states she has to take 800 mg of Ibuprofen to help with the pain.

## 2021-09-03 NOTE — Telephone Encounter (Signed)
Patient calling in reference to right shoulder pain/ inflammation she has been having for 2-3 days now. Per patient, she has tried increasing Plaquenil to 2 tabs a day, and Ibuprofen without relief. Patient  did start taking Aleve 2tabs poq 4 hours, and after the third dose did receive some pain relief. Please call to advise.

## 2021-09-03 NOTE — Telephone Encounter (Signed)
Please contact her to schedule an appointment next Monday, okay to overbook or whenever works for her. If symptoms improve with conservative steps in the meantime we can cancel appt.  FYI- I spoke with Maria Sutton increased right shoulder pain coming and going for a few days at a time it has improvement with oral NSAIDs but quickly worsening with any interruption in drugs. 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 I recommended trying to rest this avoiding all lifting 20 lbs or greater for the next week can try icing and continued NSAIDs and the hydroxychloroquine.

## 2021-09-04 NOTE — Telephone Encounter (Signed)
Patient called back in reference to work note. Patient would like for you to fax note directly to her work. Fax # [phone]. Please advise patient when faxed.

## 2021-09-04 NOTE — Telephone Encounter (Signed)
Spoke with patient and scheduled an appointment for 09/10/2021 at 3:40 pm. If symptoms improve with conservative steps in the meantime we can cancel appt.

## 2021-09-09 NOTE — Progress Notes (Signed)
Office Visit Note  Patient: Maria Sutton             Date of Birth: 23-Nov-1995           MRN: [id number]             PCP: Pcp, No Referring: No ref. provider found Visit Date: 09/10/2021   Subjective:  Pain of the Right Shoulder   History of Present Illness: Maria Sutton is a 25 y.o. female here for follow up for seropositive RA on hydroxychloroquine 200 mg daily with new development of increased joint pain at several sites.  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  She was having only partial improvement with taking ibuprofen for the increased joint pain she increased her hydroxychloroquine to 2 tablets daily as well.  For the past week she is avoided any heavy overhead lifting and right shoulder pain has improved but is also developed increased symptoms in right wrist and left hand PIP joints.  These have not been associated with any significant swelling or erythema.  Previous HPI 04/23/21 Maria Sutton is a 25 y.o. female here for follow up for seropositive RA on hydroxychloroquine 200 mg PO daily.  Since her last visit she has not noticed any major flareup of joint pain stiffness or swelling.  She not had any more problems since lowering the hydroxychloroquine to the 1 tablet daily dose.  She stopped taking the medicine for a few periods of time I think she noticed some increased joint pains particularly of the shoulders this improved again.  She was seen at the ED for viral upper respiratory infection a few times in April but no other significant infections.   10/25/2020 Maria Sutton is a 25 y.o. 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 She decreased her dose of medication from 4070mto 20039mue to intolerance and noticed problems such as sleep  disturbance, jaw pain, eye twitching she was not sure which related to the medication or underlying issues. She feels that symptoms are partially improved with this, particularly TMJ pain also her right arm pain is improved since last visit and has not had a repeat episode of sharp chest pain. She does continue to have headaches and fatigue and right eyebrow twitching is worse.   DMARD Hx HCQ 08/2020-current   Labs 08/2020 ANA 1:320 speckled RF 42 CCP >250 SSA >8.0   08/2020 Vitamin D 26.7   Review of Systems  Constitutional:  Negative for fatigue.  HENT:  Negative for mouth dryness.   Eyes:  Negative for dryness.  Respiratory:  Negative for shortness of breath.   Cardiovascular:  Negative for swelling in legs/feet.  Gastrointestinal:  Positive for constipation.  Endocrine: Negative for increased urination.  Genitourinary:  Negative for difficulty urinating.  Musculoskeletal:  Positive for joint pain, joint pain, joint swelling and morning stiffness.  Skin:  Negative for rash.  Allergic/Immunologic: Negative for susceptible to infections.  Neurological:  Negative for weakness.  Hematological:  Negative for bruising/bleeding tendency.  Psychiatric/Behavioral:  Negative for sleep disturbance.    PMFS History:  Patient Active Problem List   Diagnosis Date Noted   Pain in right shoulder 09/10/2021   Rheumatoid arthritis with rheumatoid factor of multiple sites without organ or systems involvement [place]22[place]  Positive ANA (antinuclear antibody) 08/22/2020   Vitamin D deficiency 03/19/2019   Obesity with body mass index (BMI) of 30.0 to 39.9 03/19/2019   Reactive depression 09/25/2018   Migraine without aura and without status migrainosus, not intractable 09/25/2018   Cognitive complaints 09/25/2018    Past Medical History:  Diagnosis Date   ADHD    Anxiety    Depression    Depression    Phreesia 08/07/2020   Headache    Lupus (HCC)    PTSD (post-traumatic stress  disorder)    Rheumatoid arthritis ([place])    Systemic lupus erythematosus ([place])    Thyroid disease    hypothyroid    Family History  Problem Relation Age of Onset   Hypertension Mother    Cancer Father    History reviewed. No pertinent surgical history. Social History   Social History Narrative   Lives with mom and dad   Right handed   Drinks 1 cup caffeine daily    There is no immunization history on file for this patient.   Objective: Vital Signs: BP 109/70 (BP Location: Left Arm, Patient Position: Sitting, Cuff Size: Normal)   Pulse 86   Resp 14   Ht '5\' 7"'  (1.702 m)   Wt 223 lb (101.2 kg)   BMI 34.93 kg/m    Physical Exam Constitutional:      Appearance: She is obese.  Eyes:     Conjunctiva/sclera: Conjunctivae normal.  Cardiovascular:     Rate and Rhythm: Normal rate and regular rhythm.  Pulmonary:     Effort: Pulmonary effort is normal.     Breath sounds: Normal breath sounds.  Skin:    General: Skin is warm and dry.  Neurological:     General: No focal deficit present.     Mental Status: She is alert.  Psychiatric:        Mood and Affect: Mood normal.     Musculoskeletal Exam:  Shoulders full ROM no tenderness or swelling Elbows full ROM no tenderness or swelling Wrists full ROM, mild right wrist tenderness over the ulnar side without palpable effusion Fingers full ROM no tenderness or swelling Knees full ROM no tenderness or swelling Ankles full ROM no tenderness or swelling   CDAI Exam: CDAI Score: 7  Patient Global: 40 mm; Provider Global: 20 mm Swollen: 0 ; Tender: 1  Joint Exam 09/10/2021      Right  Left  Wrist   Tender        Investigation: No additional findings.  Imaging: No results found.  Recent Labs: Lab Results  Component Value Date   WBC 8.3 06/25/2021   HGB 14.5 06/25/2021   PLT 274 06/25/2021   NA 137 06/25/2021   K 3.8 06/25/2021   CL 104 06/25/2021   CO2 25 06/25/2021   GLUCOSE 97 06/25/2021   BUN 9  06/25/2021   CREATININE 0.69 06/25/2021   BILITOT 0.5 06/25/2021   ALKPHOS 63 06/25/2021   AST 13 (L) 06/25/2021   ALT 13 06/25/2021   PROT 7.3 06/25/2021   ALBUMIN 4.1 06/25/2021   CALCIUM 8.8 (L) 06/25/2021   GFRAA 137 08/10/2020    Speciality Comments: PLQ Eye Exam- [place] 02/2021  Procedures:  No procedures performed Allergies: Other   Assessment / Plan:     Visit Diagnoses: Rheumatoid arthritis with rheumatoid factor of multiple sites without organ or systems involvement ([place]) - Plan: Sedimentation rate, C-reactive protein, CBC with Differential/Platelet  Increase symptoms of multiple joints though  no objective inflammatory changes present on exam today for RA disease activity.  May be that disease control is tenuous and having symptom increase with heavier exertion.  We will check ESR and CRP test also checking CBC.  Discussed treatment options such as methotrexate or increasing current doses we will try increasing the hydroxychloroquine dose discussed trying a incremental increase to 1.5 tablets/day or to take 1 tablet at 2 separate occasions to minimize the GI side effects.  Positive ANA (antinuclear antibody)  No other systemic changes present currently outside of the increased joint pains.  Acute pain of right shoulder - Plan: Sedimentation rate, C-reactive protein, CBC with Differential/Platelet  Shoulder pain appears improved after time also with avoiding heavy use exam is now normal.  Checking for inflammatory activity as above.  Orders: Orders Placed This Encounter  Procedures   Sedimentation rate   C-reactive protein   CBC with Differential/Platelet    No orders of the defined types were placed in this encounter.    Follow-Up Instructions: No follow-ups on file.   Collier Salina, MD  Note - This record has been created using [employer].  Chart creation errors have been sought, but may not always  have been located. Such creation errors  do not reflect on  the standard of medical care.

## 2021-09-10 ENCOUNTER — Ambulatory Visit (INDEPENDENT_AMBULATORY_CARE_PROVIDER_SITE_OTHER): Payer: 59 | Admitting: Internal Medicine

## 2021-09-10 ENCOUNTER — Encounter: Payer: Self-pay | Admitting: Internal Medicine

## 2021-09-10 ENCOUNTER — Other Ambulatory Visit: Payer: Self-pay

## 2021-09-10 VITALS — BP 109/70 | HR 86 | Resp 14 | Ht 67.0 in | Wt 223.0 lb

## 2021-09-10 DIAGNOSIS — M0579 Rheumatoid arthritis with rheumatoid factor of multiple sites without organ or systems involvement: Secondary | ICD-10-CM

## 2021-09-10 DIAGNOSIS — R768 Other specified abnormal immunological findings in serum: Secondary | ICD-10-CM

## 2021-09-10 DIAGNOSIS — M25511 Pain in right shoulder: Secondary | ICD-10-CM | POA: Diagnosis not present

## 2021-09-10 NOTE — Patient Instructions (Signed)
I am checking lab tests today for indicators of systemic inflammation including sedimentation rate and c-reactive protein. Also checking complete blood count for any changes with medications or and increase in white blood cell count indicating severe inflammation.

## 2021-09-11 ENCOUNTER — Telehealth: Payer: Self-pay

## 2021-09-11 LAB — CBC WITH DIFFERENTIAL/PLATELET
Absolute Monocytes: 590 cells/uL (ref 200–950)
Basophils Absolute: 18 cells/uL (ref 0–200)
Basophils Relative: 0.2 %
Eosinophils Absolute: 123 cells/uL (ref 15–500)
Eosinophils Relative: 1.4 %
HCT: 44.5 % (ref 35.0–45.0)
Hemoglobin: 14.9 g/dL (ref 11.7–15.5)
Lymphs Abs: 2737 cells/uL (ref 850–3900)
MCH: 27.6 pg (ref 27.0–33.0)
MCHC: 33.5 g/dL (ref 32.0–36.0)
MCV: 82.4 fL (ref 80.0–100.0)
MPV: 11.3 fL (ref 7.5–12.5)
Monocytes Relative: 6.7 %
Neutro Abs: 5333 cells/uL (ref 1500–7800)
Neutrophils Relative %: 60.6 %
Platelets: 290 10*3/uL (ref 140–400)
RBC: 5.4 10*6/uL — ABNORMAL HIGH (ref 3.80–5.10)
RDW: 12.1 % (ref 11.0–15.0)
Total Lymphocyte: 31.1 %
WBC: 8.8 10*3/uL (ref 3.8–10.8)

## 2021-09-11 LAB — C-REACTIVE PROTEIN: CRP: 2.4 mg/L (ref <8.0)

## 2021-09-11 LAB — SEDIMENTATION RATE: Sed Rate: 9 mm/h (ref 0–20)

## 2021-09-11 NOTE — Telephone Encounter (Signed)
Patient called stating her job needs written documentation from Dr. Dimple Casey stating she can return to work with no restrictions.  Please fax #[phone] Attention:  Irving Copas

## 2021-09-11 NOTE — Progress Notes (Signed)
Blood tests do not show any signs of increased systemic inflammation. I am not sure why symptoms are so much worse unless it is overuse related. I think it is reasonable to just try the increase slightly with hydroxychloroquine for now and as needed NSAIDs.

## 2021-09-14 NOTE — Telephone Encounter (Signed)
Letter faxed.

## 2021-09-14 NOTE — Telephone Encounter (Signed)
Printed and signed document today for this.

## 2021-09-26 ENCOUNTER — Other Ambulatory Visit: Payer: Self-pay | Admitting: Internal Medicine

## 2021-11-20 ENCOUNTER — Ambulatory Visit (INDEPENDENT_AMBULATORY_CARE_PROVIDER_SITE_OTHER): Payer: 59

## 2021-11-20 ENCOUNTER — Other Ambulatory Visit: Payer: Self-pay

## 2021-11-20 ENCOUNTER — Encounter (HOSPITAL_COMMUNITY): Payer: Self-pay | Admitting: Emergency Medicine

## 2021-11-20 ENCOUNTER — Ambulatory Visit (HOSPITAL_COMMUNITY)
Admission: EM | Admit: 2021-11-20 | Discharge: 2021-11-20 | Disposition: A | Discharge status: Home / Self Care | Payer: 59 | Attending: Family Medicine | Admitting: Family Medicine

## 2021-11-20 DIAGNOSIS — S62664A Nondisplaced fracture of distal phalanx of right ring finger, initial encounter for closed fracture: Secondary | ICD-10-CM

## 2021-11-20 MED ORDER — TRAMADOL HCL 50 MG PO TABS: 50.0000 mg | ORAL_TABLET | Freq: Four times a day (QID) | ORAL | 0 refills | Status: DC | PRN

## 2021-11-20 NOTE — ED Triage Notes (Signed)
Pt reports ring finger injury on right hand lastnight. Pt states finger is swollen and pain increases with movement.
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
## 2021-11-20 NOTE — Discharge Instructions (Addendum)
You have a fracture that is small of the in bone of your ring finger.  It is not displaced.  Wear the splint provided today  Ice and elevate for the next 48 hours or so.  call the hand surgeon specialist provided in your paperwork to make an appointment for the next 3 to 4 days

## 2021-11-22 DIAGNOSIS — M20011 Mallet finger of right finger(s): Secondary | ICD-10-CM | POA: Diagnosis not present

## 2021-11-22 DIAGNOSIS — S62634A Displaced fracture of distal phalanx of right ring finger, initial encounter for closed fracture: Secondary | ICD-10-CM | POA: Diagnosis not present

## 2021-12-25 ENCOUNTER — Other Ambulatory Visit: Payer: Self-pay | Admitting: *Deleted

## 2021-12-25 DIAGNOSIS — M79644 Pain in right finger(s): Secondary | ICD-10-CM | POA: Diagnosis not present

## 2021-12-25 DIAGNOSIS — M20011 Mallet finger of right finger(s): Secondary | ICD-10-CM | POA: Diagnosis not present

## 2021-12-25 MED ORDER — HYDROXYCHLOROQUINE SULFATE 200 MG PO TABS: 200.0000 mg | ORAL_TABLET | Freq: Every day | ORAL | 1 refills | Status: DC

## 2021-12-25 NOTE — Telephone Encounter (Signed)
Refill request received via fax ? ?Next Visit: 04/23/2022 ? ?Last Visit: 09/10/2021 ? ?Labs: 09/10/2021 Blood tests do not show any signs of increased systemic inflammation.  ? ?Eye exam: [facility] 02/2021  ? ?Current Dose per office note 09/10/2021:  hydroxychloroquine dose discussed trying a incremental increase to 1.5 tablets/day or to take 1 tablet at 2 separate occasions to minimize the GI side effects. ? ?DX: Rheumatoid arthritis with rheumatoid factor of multiple sites without organ or systems involvement  ? ?Last Fill: 09/26/2021 ? ?Okay to refill Plaquenil?  ?

## 2022-01-24 DIAGNOSIS — M20011 Mallet finger of right finger(s): Secondary | ICD-10-CM | POA: Diagnosis not present

## 2022-03-27 IMAGING — DX DG CHEST 2V
2 series · 2 of 2 positions shown · non-contrast
Comparison: None.

CLINICAL DATA: Chest pain this evening.

EXAM:
CHEST - 2 VIEW

[chest pa]
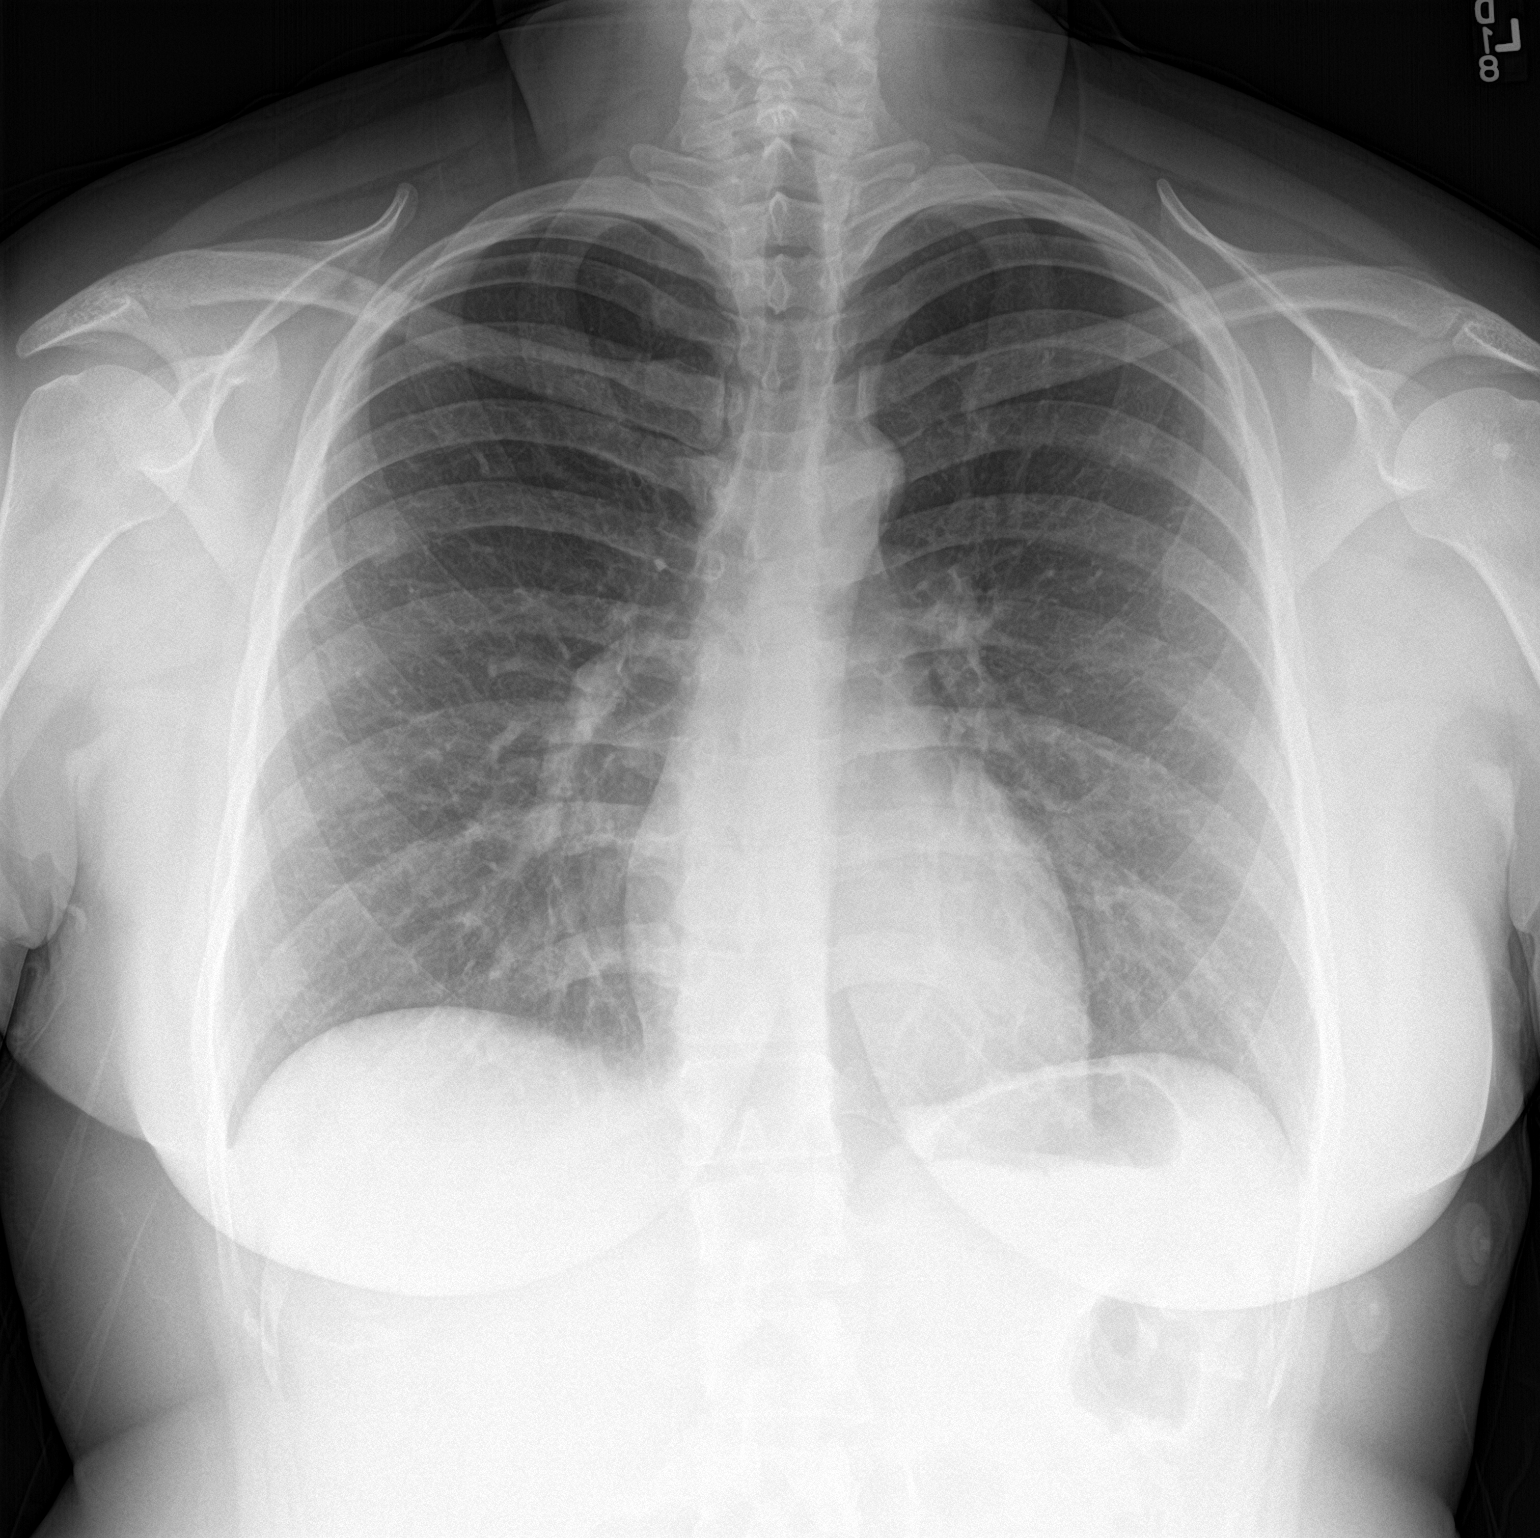

[chest lat]
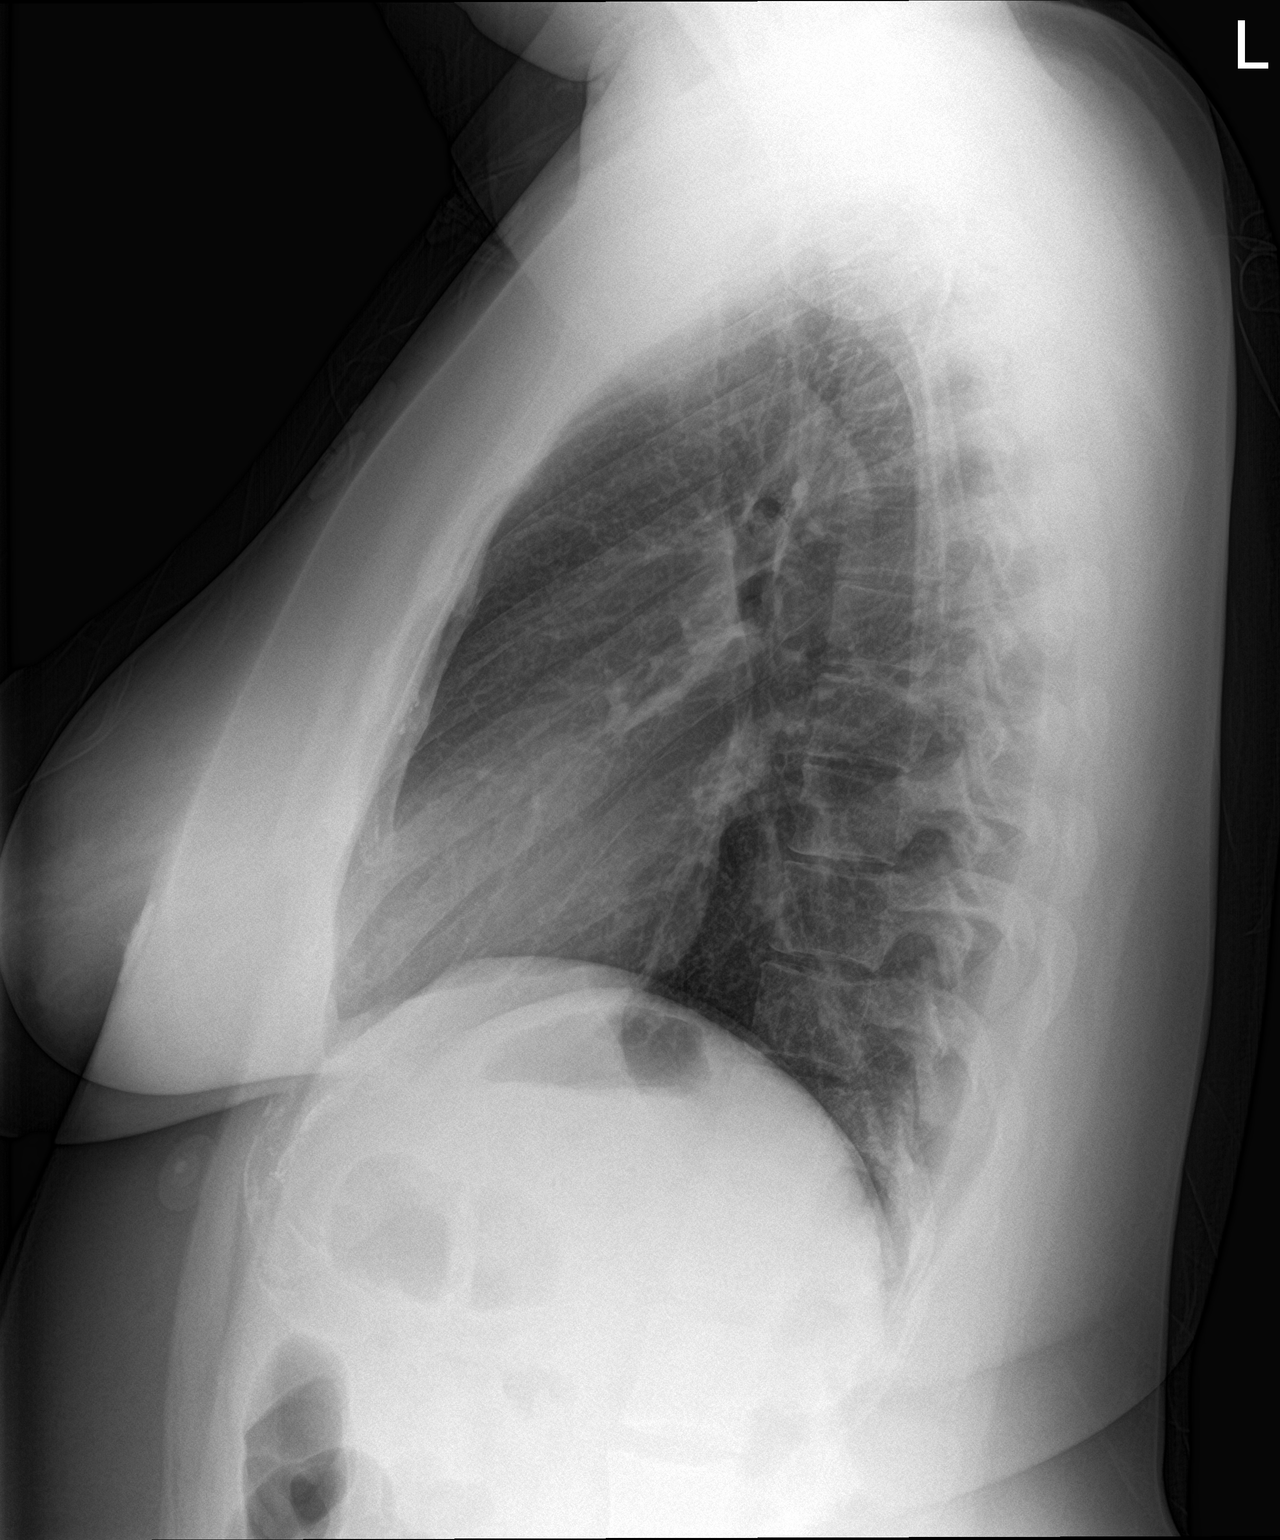

[2 of 2 positions shown; findings below may reference images not displayed]

FINDINGS: The heart size and mediastinal contours are normal. The lungs are
clear. There is no pleural effusion or pneumothorax. No acute
osseous findings are identified. Minimal convex right thoracic
scoliosis may be positional.
IMPRESSION: No active cardiopulmonary process.

## 2022-03-27 NOTE — Progress Notes (Signed)
Office Visit Note  Patient: Maria Sutton             Date of Birth: 02-08-1996           MRN: [id number]             PCP: Pcp, No Referring: No ref. provider found Visit Date: 03/28/2022   Subjective:   History of Present Illness: Maria Sutton is a 26 y.o. female here for follow up for seropositive RA on HCQ 200 mg daily.  She is overall doing fairly well most frequent problem have been exacerbations of right shoulder pain.  She notices this most often after a event where she can recall some overuse or catch full use applying a lot of pressure in the area.  She is getting more improvement with turmeric than NSAIDs as needed.  Also had a right ring finger distal fracture without complication.  She saw ophthalmology last year in May but not yet this year.   Previous HPI 09/10/21 Maria Sutton is a 26 y.o. female here for follow up for seropositive RA on hydroxychloroquine 200 mg daily with new development of increased joint pain at several sites.  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  She was having only partial improvement with taking ibuprofen for the increased joint pain she increased her hydroxychloroquine to 2 tablets daily as well.  For the past week she is avoided any heavy overhead lifting and right shoulder pain has improved but is also developed increased symptoms in right wrist and left hand PIP joints.  These have not been associated with any significant swelling or erythema.   Previous HPI 04/23/21 Maria Sutton is a 27 y.o. female here for follow up for seropositive RA on hydroxychloroquine 200 mg PO daily.  Since her last visit she has not noticed any major flareup of joint pain stiffness or swelling.  She not had any more problems since lowering the hydroxychloroquine to the 1 tablet daily dose.  She stopped taking the medicine  for a few periods of time I think she noticed some increased joint pains particularly of the shoulders this improved again.  She was seen at the ED for viral upper respiratory infection a few times in April but no other significant infections.   10/25/2020 Maria Sutton is a 26 y.o. 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 She decreased her dose of medication from 400mg  to 200mg  due to intolerance and noticed problems such as sleep disturbance, jaw pain, eye twitching she was not sure which related to the medication or underlying issues. She feels that symptoms are partially improved with this, particularly TMJ pain also her right arm pain is improved since last visit and has not had a repeat episode of sharp chest pain. She does continue to have headaches and fatigue and right eyebrow twitching is worse.   DMARD Hx HCQ 08/2020-current   Labs 08/2020 ANA 1:320 speckled RF 42 CCP >250 SSA >8.0   08/2020 Vitamin D 26.7   Review of Systems  Constitutional:  Positive for fatigue.  HENT:  Positive for mouth dryness.   Eyes:  Negative for dryness.  Respiratory:  Negative for shortness of breath.   Cardiovascular:  Negative for swelling in legs/feet.  Gastrointestinal:  Negative for constipation.  Endocrine: Negative for excessive thirst.  Genitourinary:  Negative for difficulty urinating.  Musculoskeletal:  Positive for joint pain, joint pain and joint swelling.  Skin:  Negative for rash.  Allergic/Immunologic: Negative for susceptible to infections.  Neurological:  Negative for numbness.  Hematological:  Negative for bruising/bleeding tendency.  Psychiatric/Behavioral:  Positive for sleep disturbance.     PMFS History:  Patient Active Problem List   Diagnosis Date Noted   Pain in right shoulder 09/10/2021   Rheumatoid arthritis with rheumatoid factor of multiple sites without organ or systems  involvement (HCC) 08/28/2020   Positive ANA (antinuclear antibody) 08/22/2020   Vitamin D deficiency 03/19/2019   Obesity with body mass index (BMI) of 30.0 to 39.9 03/19/2019   Reactive depression 09/25/2018   Migraine without aura and without status migrainosus, not intractable 09/25/2018   Cognitive complaints 09/25/2018    Past Medical History:  Diagnosis Date   ADHD    Anxiety    Depression    Depression    Phreesia 08/07/2020   Headache    Lupus (HCC)    PCOS (polycystic ovarian syndrome)    PTSD (post-traumatic stress disorder)    Rheumatoid arthritis (HCC)    Systemic lupus erythematosus (HCC)    Thyroid disease    hypothyroid    Family History  Problem Relation Age of Onset   Hypertension Mother    Cancer Father    History reviewed. No pertinent surgical history. Social History   Social History Narrative   Lives with mom and dad   Right handed   Drinks 1 cup caffeine daily    There is no immunization history on file for this patient.   Objective: Vital Signs: BP 123/84 (BP Location: Left Arm, Patient Position: Sitting, Cuff Size: Small)   Pulse 68   Resp 12   Ht 5\' 7"  (1.702 m)   Wt 224 lb 6.4 oz (101.8 kg)   BMI 35.15 kg/m    Physical Exam Cardiovascular:     Rate and Rhythm: Normal rate and regular rhythm.  Pulmonary:     Effort: Pulmonary effort is normal.     Breath sounds: Normal breath sounds.  Musculoskeletal:     Right lower leg: No edema.     Left lower leg: No edema.  Skin:    General: Skin is warm and dry.     Findings: No rash.  Neurological:     Mental Status: She is alert.  Psychiatric:        Mood and Affect: Mood normal.      Musculoskeletal Exam: Shoulders full ROM no tenderness to pressure, mild tenderness at lateral and posterior of right shoulder with overhead movement and external rotation Elbows full ROM no tenderness or swelling Wrists full ROM no tenderness or swelling Fingers full ROM no tenderness or  swelling Knees full ROM no tenderness or swelling  Investigation: No additional findings.  Imaging: No results found.  Recent Labs: Lab Results  Component Value Date   WBC 6.9 03/28/2022   HGB 15.9 (H) 03/28/2022   PLT 277 03/28/2022   NA 139 03/28/2022   K 4.2 03/28/2022   CL 104 03/28/2022   CO2 23 03/28/2022   GLUCOSE 76 03/28/2022   BUN 7 03/28/2022   CREATININE 0.73 03/28/2022   BILITOT 0.5 06/25/2021   ALKPHOS 63 06/25/2021   AST 13 (L) 06/25/2021   ALT 13 06/25/2021   PROT 7.3 06/25/2021   ALBUMIN 4.1 06/25/2021   CALCIUM 9.5 03/28/2022   GFRAA  137 08/10/2020    Speciality Comments: PLQ Eye Exam- [facility] 02/2021  Procedures:  No procedures performed Allergies: Other   Assessment / Plan:     Visit Diagnoses: Rheumatoid arthritis with rheumatoid factor of multiple sites without organ or systems involvement (HCC) - Plan: CBC with Differential/Platelet, BASIC METABOLIC PANEL WITH GFR, Sedimentation rate  Appears to be in low disease activity or remission.  I suspect the right shoulder pains are more use and occupation related than inflammatory.  If current exacerbation not improving within 4 to 6 weeks recommend she contact again consider imaging and further treatment such as local injection or physical therapy evaluation.  Wrist and small joint problems appear well.  Plan to continue hydroxychloroquine 200 mg daily.  Checking sed rate for disease activity monitoring.  Checking CBC and basic metabolic panel for long-term medication monitoring.  Positive ANA (antinuclear antibody)  No new extra-articular symptom complaint.  Still has some fatigue chronically.  Orders: Orders Placed This Encounter  Procedures   CBC with Differential/Platelet   BASIC METABOLIC PANEL WITH GFR   Sedimentation rate   No orders of the defined types were placed in this encounter.    Follow-Up Instructions: Return in about 6 months (around 09/27/2022) for RA on HCQ/turmeric  PRN f/u 72mos.   Fuller Plan, MD  Note - This record has been created using [employer].  Chart creation errors have been sought, but may not always  have been located. Such creation errors do not reflect on  the standard of medical care.

## 2022-03-28 ENCOUNTER — Encounter: Payer: Self-pay | Admitting: Internal Medicine

## 2022-03-28 ENCOUNTER — Ambulatory Visit (INDEPENDENT_AMBULATORY_CARE_PROVIDER_SITE_OTHER): Payer: 59 | Admitting: Internal Medicine

## 2022-03-28 VITALS — BP 123/84 | HR 68 | Resp 12 | Ht 67.0 in | Wt 224.4 lb

## 2022-03-28 DIAGNOSIS — M0579 Rheumatoid arthritis with rheumatoid factor of multiple sites without organ or systems involvement: Secondary | ICD-10-CM | POA: Diagnosis not present

## 2022-03-28 DIAGNOSIS — R768 Other specified abnormal immunological findings in serum: Secondary | ICD-10-CM | POA: Diagnosis not present

## 2022-03-28 NOTE — Patient Instructions (Signed)
I recommend contact [facility] office about scheduling an eye exam for this year for the hydroxychloroquine monitoring.  Continue current medication, if shoulder pain worsens for more than a few weeks you can let us know in case there is a more severe injury.

## 2022-03-29 LAB — CBC WITH DIFFERENTIAL/PLATELET
Absolute Monocytes: 331 cells/uL (ref 200–950)
Basophils Absolute: 21 cells/uL (ref 0–200)
Basophils Relative: 0.3 %
Eosinophils Absolute: 83 cells/uL (ref 15–500)
Eosinophils Relative: 1.2 %
HCT: 47.4 % — ABNORMAL HIGH (ref 35.0–45.0)
Hemoglobin: 15.9 g/dL — ABNORMAL HIGH (ref 11.7–15.5)
Lymphs Abs: 2519 cells/uL (ref 850–3900)
MCH: 28.2 pg (ref 27.0–33.0)
MCHC: 33.5 g/dL (ref 32.0–36.0)
MCV: 84.2 fL (ref 80.0–100.0)
MPV: 10.8 fL (ref 7.5–12.5)
Monocytes Relative: 4.8 %
Neutro Abs: 3947 cells/uL (ref 1500–7800)
Neutrophils Relative %: 57.2 %
Platelets: 277 10*3/uL (ref 140–400)
RBC: 5.63 10*6/uL — ABNORMAL HIGH (ref 3.80–5.10)
RDW: 12.7 % (ref 11.0–15.0)
Total Lymphocyte: 36.5 %
WBC: 6.9 10*3/uL (ref 3.8–10.8)

## 2022-03-29 LAB — SEDIMENTATION RATE: Sed Rate: 9 mm/h (ref 0–20)

## 2022-03-29 LAB — BASIC METABOLIC PANEL WITH GFR
BUN: 7 mg/dL (ref 7–25)
CO2: 23 mmol/L (ref 20–32)
Calcium: 9.5 mg/dL (ref 8.6–10.2)
Chloride: 104 mmol/L (ref 98–110)
Creat: 0.73 mg/dL (ref 0.50–0.96)
Glucose, Bld: 76 mg/dL (ref 65–99)
Potassium: 4.2 mmol/L (ref 3.5–5.3)
Sodium: 139 mmol/L (ref 135–146)
eGFR: 118 mL/min/{1.73_m2} (ref >=60)

## 2022-04-23 ENCOUNTER — Encounter (HOSPITAL_BASED_OUTPATIENT_CLINIC_OR_DEPARTMENT_OTHER): Payer: Self-pay

## 2022-04-23 ENCOUNTER — Other Ambulatory Visit: Payer: Self-pay

## 2022-04-23 ENCOUNTER — Emergency Department (HOSPITAL_BASED_OUTPATIENT_CLINIC_OR_DEPARTMENT_OTHER)
Admission: EM | Admit: 2022-04-23 | Discharge: 2022-04-23 | Disposition: A | Discharge status: Home / Self Care | Payer: 59 | Attending: Emergency Medicine | Admitting: Emergency Medicine

## 2022-04-23 ENCOUNTER — Ambulatory Visit: Payer: 59 | Admitting: Internal Medicine

## 2022-04-23 DIAGNOSIS — Y9389 Activity, other specified: Secondary | ICD-10-CM | POA: Diagnosis not present

## 2022-04-23 DIAGNOSIS — W260XXA Contact with knife, initial encounter: Secondary | ICD-10-CM | POA: Diagnosis not present

## 2022-04-23 DIAGNOSIS — S51812A Laceration without foreign body of left forearm, initial encounter: Secondary | ICD-10-CM | POA: Diagnosis not present

## 2022-04-23 DIAGNOSIS — S59912A Unspecified injury of left forearm, initial encounter: Secondary | ICD-10-CM | POA: Diagnosis not present

## 2022-04-23 MED ORDER — LIDOCAINE-EPINEPHRINE (PF) 2 %-1:200000 IJ SOLN
10.0000 mL | Freq: Once | INTRAMUSCULAR | Status: AC
  Administered 2022-04-23: 10 mL
  Filled 2022-04-23: qty 20

## 2022-04-23 MED ORDER — LIDOCAINE-EPINEPHRINE-TETRACAINE (LET) TOPICAL GEL
3.0000 mL | Freq: Once | TOPICAL | Status: AC
  Administered 2022-04-23: 3 mL via TOPICAL
  Filled 2022-04-23: qty 3

## 2022-04-23 MED ORDER — LORAZEPAM 1 MG PO TABS
1.0000 mg | ORAL_TABLET | Freq: Once | ORAL | Status: AC
  Administered 2022-04-23: 1 mg via ORAL
  Filled 2022-04-23: qty 1

## 2022-04-23 NOTE — ED Notes (Addendum)
Bleeding controlled upon arrival.

## 2022-04-23 NOTE — ED Provider Notes (Signed)
[facility] EMERGENCY DEPARTMENT Provider Note   CSN: [id number] Arrival date & time: 04/23/22  1839     History  Chief Complaint  Patient presents with   Extremity Laceration    Maria Sutton is a 26 y.o. female who presents to the emergency department for a laceration to the left forearm.  Patient states that she was using a knife to try to cut open a package of frozen meat, when the knife slipped and cut her left forearm.  They were able to control the bleeding with direct pressure.  Believes her tetanus is up-to-date.  Patient works for [employer].  Patient reports history of PTSD with severe anxiety surrounding needles.  She is requesting medication to help her relax for laceration repair.  HPI     Home Medications Prior to Admission medications   Medication Sig Start Date End Date Taking? Authorizing Provider  cholecalciferol (VITAMIN D3) 25 MCG (1000 UNIT) tablet Take 1,000 Units by mouth daily. Patient not taking: Reported on 03/28/2022    [provider]  Drospirenone (SLYND) 4 MG TABS Take 1 tablet by mouth daily. 07/09/21   Constant, Peggy, MD  hydroxychloroquine (PLAQUENIL) 200 MG tablet Take 1 tablet (200 mg total) by mouth daily. 12/25/21   Rice, Jamesetta Orleans, MD  traMADol (ULTRAM) 50 MG tablet Take 1 tablet (50 mg total) by mouth every 6 (six) hours as needed. Patient not taking: Reported on 03/28/2022 11/20/21   Zenia Resides, MD      Allergies    Other    Review of Systems   Review of Systems  Skin:  Positive for wound.  All other systems reviewed and are negative.   Physical Exam Updated Vital Signs BP 127/87 (BP Location: Right Arm)   Pulse (!) 101   Temp 98.8 F (37.1 C) (Oral)   Resp 18   Ht 5\' 7"  (1.702 m)   Wt 99.8 kg   SpO2 100%   BMI 34.46 kg/m  Physical Exam Vitals and nursing note reviewed.  Constitutional:      Appearance: Normal appearance.  HENT:     Head: Normocephalic and atraumatic.   Eyes:     Conjunctiva/sclera: Conjunctivae normal.  Pulmonary:     Effort: Pulmonary effort is normal. No respiratory distress.  Skin:    General: Skin is warm and dry.     Comments: Approximately 4.5 cm linear laceration to the medial left forearm, without tendon involvement  Neurological:     Mental Status: She is alert.  Psychiatric:        Mood and Affect: Mood normal.        Behavior: Behavior normal.    ED Results / Procedures / Treatments   Labs (all labs ordered are listed, but only abnormal results are displayed) Labs Reviewed - No data to display  EKG None  Radiology No results found.  Procedures . Laceration Repair  Date/Time: 04/24/2022 12:23 AM  Performed by: 04/26/2022, PA-C Authorized by: Su Monks, PA-C   Consent:    Consent obtained:  Verbal   Consent given by:  Patient   Risks, benefits, and alternatives were discussed: yes     Risks discussed:  Infection, pain, poor wound healing and need for additional repair Universal protocol:    Patient identity confirmed:  Verbally with patient Anesthesia:    Anesthesia method:  Topical application and local infiltration   Topical anesthetic:  LET   Local anesthetic:  Lidocaine 2% WITH epi  Laceration details:    Location:  Shoulder/arm   Shoulder/arm location:  L lower arm   Length (cm):  4.5 Pre-procedure details:    Preparation:  Patient was prepped and draped in usual sterile fashion Exploration:    Hemostasis achieved with:  LET Treatment:    Area cleansed with:  Saline   Amount of cleaning:  Standard   Irrigation solution:  Sterile saline   Irrigation method:  Pressure wash   Debridement:  None   Undermining:  None Skin repair:    Repair method:  Sutures   Suture size:  5-0   Suture material:  Nylon   Suture technique:  Simple interrupted   Number of sutures:  4 Approximation:    Approximation:  Close Repair type:    Repair type:  Simple Post-procedure details:     Dressing:  Non-adherent dressing   Procedure completion:  Tolerated with difficulty     Medications Ordered in ED Medications  lidocaine-EPINEPHrine-tetracaine (LET) topical gel (3 mLs Topical Given 04/23/22 2022)  LORazepam (ATIVAN) tablet 1 mg (1 mg Oral Given 04/23/22 2021)  lidocaine-EPINEPHrine (XYLOCAINE W/EPI) 2 %-1:200000 (PF) injection 10 mL (10 mLs Infiltration Given 04/23/22 2023)    ED Course/ Medical Decision Making/ A&P                           Medical Decision Making Risk Prescription drug management.  Patient is otherwise healthy 26 year old female who presents the emergency department a laceration to her left forearm.  Tetanus up-to-date.  Reported PTSD and severe anxiety regarding needles.  On exam patient has a approximately 4.5 cm linear laceration to the left forearm without tendon involvement.  Laceration was anesthestized with let gel and lidocaine with epinephrine and closed with sutures.  Patient given Ativan prior to procedure to help with anxiety.  Patient tolerated procedure with no immediate complications.  Patient has no comorbid conditions that would impact wound healing, so will defer antibiotics at this time.  Given return precautions for suture removal, and patient charged in stable condition.  Final Clinical Impression(s) / ED Diagnoses Final diagnoses:  Forearm laceration, left, initial encounter    Rx / DC Orders ED Discharge Orders     None      Portions of this report may have been transcribed using voice recognition software. Every effort was made to ensure accuracy; however, inadvertent computerized transcription errors may be present.    Lasharon Dunivan T, PA-C 04/24/22 0026    Franne Forts, DO 04/25/22 [phone]

## 2022-04-23 NOTE — Discharge Instructions (Addendum)
You were seen in the emergency department for an arm laceration.   We have closed your laceration(s) with sutures. These need to be removed in 7 days. This can be done at any doctor's office, urgent care, or emergency department.   If any of the sutures come out before it is time for removal, that is okay. Make sure to keep the area as clean and dry as possible. You can let warm soapy warm run over the area, but do NOT scrub it.   Watch out for signs of infection, like we discussed, including: increased redness, tenderness, or drainage of pus from the area. If this happens and you have not been prescribed an antibiotic, please seek medical attention for possible infection.

## 2022-04-23 NOTE — ED Notes (Signed)
Laceration to left wrist, pt anxious Bleeding controlled Let applied and Ativan given friends at bedside

## 2022-04-23 NOTE — ED Triage Notes (Signed)
States tried to cut a frozen meat package and knife slipped and cut left wrist. Bleeding controlled during triage. Believes tetanus is utd

## 2022-04-29 ENCOUNTER — Ambulatory Visit
Admission: RE | Admit: 2022-04-29 | Discharge: 2022-04-29 | Disposition: A | Discharge status: Home / Self Care | Payer: 59 | Source: Ambulatory Visit

## 2022-04-29 NOTE — ED Triage Notes (Signed)
Pt is present today for a suture removal. Pt denies any pain or discomfort

## 2022-05-02 ENCOUNTER — Telehealth: Payer: 59

## 2022-05-13 ENCOUNTER — Encounter: Payer: Self-pay | Admitting: Family Medicine

## 2022-05-13 ENCOUNTER — Ambulatory Visit (INDEPENDENT_AMBULATORY_CARE_PROVIDER_SITE_OTHER): Payer: 59 | Admitting: Family Medicine

## 2022-05-13 VITALS — BP 124/80 | HR 88 | Temp 98.1°F | Ht 67.0 in | Wt 229.4 lb

## 2022-05-13 DIAGNOSIS — M0579 Rheumatoid arthritis with rheumatoid factor of multiple sites without organ or systems involvement: Secondary | ICD-10-CM

## 2022-05-13 DIAGNOSIS — F4310 Post-traumatic stress disorder, unspecified: Secondary | ICD-10-CM | POA: Diagnosis not present

## 2022-05-13 DIAGNOSIS — F329 Major depressive disorder, single episode, unspecified: Secondary | ICD-10-CM | POA: Diagnosis not present

## 2022-05-13 DIAGNOSIS — R69 Illness, unspecified: Secondary | ICD-10-CM | POA: Diagnosis not present

## 2022-05-13 DIAGNOSIS — G43009 Migraine without aura, not intractable, without status migrainosus: Secondary | ICD-10-CM

## 2022-05-13 NOTE — Patient Instructions (Signed)
Welcome to [facility]! It was a pleasure meeting you today. ° °As discussed, Please schedule a 12 month follow up visit today. ° °PLEASE NOTE: ° °If you had any LAB tests please let us know if you have not heard back within a few days. You may see your results on MyChart before we have a chance to review them but we will give you a call once they are reviewed by us. If we ordered any REFERRALS today, please let us know if you have not heard from their office within the next week.  °Let us know through MyChart if you are needing REFILLS, or have your pharmacy send us the request. You can also use MyChart to communicate with me or any office staff. ° °Please try these tips to maintain a healthy lifestyle: ° °Eat most of your calories during the day when you are active. Eliminate processed foods including packaged sweets (pies, cakes, cookies), reduce intake of potatoes, white bread, white pasta, and white rice. Look for whole grain options, oat flour or almond flour. ° °Each meal should contain half fruits/vegetables, one quarter protein, and one quarter carbs (no bigger than a computer mouse). ° °Cut down on sweet beverages. This includes juice, soda, and sweet tea. Also watch fruit intake, though this is a healthier sweet option, it still contains natural sugar! Limit to 3 servings daily. ° °Drink at least 1 glass of water with each meal and aim for at least 8 glasses per day ° °Exercise at least 150 minutes every week.   °

## 2022-05-13 NOTE — Progress Notes (Signed)
New Patient Office Visit  Subjective:  Patient ID: Maria Sutton, female    DOB: June 30, 1996  Age: 26 y.o. MRN: [id number]  CC:  Chief Complaint  Patient presents with   Establish Care    Need new pcp Discuss making her cat her ESA Not fasting     HPI Maria Sutton presents for new pt  Depression-has a cat that got for depression 3-4 yrs ago.  Helps.  Was doing poorly in school d/t anxiety/syncope.  So got cat as companion and helped a lot while in college.  Did drop out of college.  Cat brings comfort and stress relief when comes home from work.  Does breathing exercises, etc.  Needs letter to keep cat-moving into new apt.   SSRI in past and ST.  CBD helped w/migraine. Not taking now.  No panic attacks for 1 yr.  No SI.   Migraine-was seeing neuro-was almost daily.  Saw neuro.  Left college-and helped.  Last one about 1 yr ago. Lupus and RA-seeing rheum-on plaquinel-labs and meds managed by rheum.  Doing ok.   Past Medical History:  Diagnosis Date   ADHD    Anxiety    Depression    Depression    Phreesia 08/07/2020   Headache    Lupus (HCC)    PCOS (polycystic ovarian syndrome)    PTSD (post-traumatic stress disorder)    Rheumatoid arthritis (HCC)    Systemic lupus erythematosus (HCC)    Thyroid disease    hypothyroid    History reviewed. No pertinent surgical history.  Family History  Problem Relation Age of Onset   Hypertension Mother    Breast cancer Other     Social History   Socioeconomic History   Marital status: Single    Spouse name: Not on file   Number of children: 0   Years of education: Not on file   Highest education level: Not on file  Occupational History    Comment: not working   Occupation: Emergency planning/management officer: [employer]  Tobacco Use   Smoking status: Never   Smokeless tobacco: Never  Vaping Use   Vaping Use: Never used  Substance and Sexual Activity   Alcohol use: Not Currently   Drug use: Never   Sexual activity: Yes     Birth control/protection: Pill  Other Topics Concern   Not on file  Social History Narrative   Lives with mom and dad   Right handed   Drinks 1 cup caffeine daily      Maria Sutton great aunt an others w/breast ca   Social Determinants of Health   Financial Resource Strain: Not on file  Food Insecurity: Not on file  Transportation Needs: Not on file  Physical Activity: Not on file  Stress: Not on file  Social Connections: Not on file  Intimate Partner Violence: Not on file    ROS  ROS: Gen: no fever, chills  Skin: no rash, itching ENT: no ear pain, ear drainage, nasal congestion, rhinorrhea, sinus pressure, sore throat Eyes: no blurry vision, double vision Resp: no cough, wheeze,SOB CV: no CP, palpitations, LE edema,  GI: no heartburn, n/v/d/c, abd pain GU: no dysuria, urgency, frequency, hematuria.  On ocp-continuous cycling.  Some break thru bleeding twice last month.   MSK:RA-doing ok.   Neuro: no dizziness, headache, weakness, vertigo Psych: no depression, anxiety, insomnia, SI   Objective:   Today's Vitals: BP 124/80   Pulse 88   Temp 98.1 F (36.7  C) (Temporal)   Ht 5\' 7"  (1.702 m)   Wt 229 lb 6 oz (104 kg)   LMP 04/24/2022   SpO2 98%   BMI 35.93 kg/m   Physical Exam  Gen: WDWN NAD HEENT: NCAT, conjunctiva not injected, sclera nonicteric TM WNL B, OP moist, no exudates  NECK:  supple, no thyromegaly, no nodes, no carotid bruits CARDIAC: RRR, S1S2+, no murmur. DP 2+B LUNGS: CTAB. No wheezes ABDOMEN:  BS+, soft, NTND, No HSM, no masses EXT:  no edema MSK: no gross abnormalities.  NEURO: A&O x3.  CN II-XII intact.  PSYCH: normal mood. Good eye contact   Assessment & Plan:   Problem List Items Addressed This Visit       Cardiovascular and Mediastinum   Migraine without aura and without status migrainosus, not intractable     Musculoskeletal and Integument   Rheumatoid arthritis with rheumatoid factor of multiple sites without organ or systems  involvement (HCC)     Other   Reactive depression   Other Visit Diagnoses     PTSD (post-traumatic stress disorder)    -  Primary      PTSD/depression-chronic  stable as long as has cat to go home to.  Will do letter for emotional support for apartment.  Work on exercise, etc.   Migraine ha-chronic.  Stable.   RA/Lupus-chronic.  Stable.  Cont plaquenil per rheum.    Outpatient Encounter Medications as of 05/13/2022  Medication Sig   cholecalciferol (VITAMIN D3) 25 MCG (1000 UNIT) tablet Take 1,000 Units by mouth daily.   Drospirenone (SLYND) 4 MG TABS Take 1 tablet by mouth daily.   hydroxychloroquine (PLAQUENIL) 200 MG tablet Take 1 tablet (200 mg total) by mouth daily.   [DISCONTINUED] traMADol (ULTRAM) 50 MG tablet Take 1 tablet (50 mg total) by mouth every 6 (six) hours as needed. (Patient not taking: Reported on 03/28/2022)   No facility-administered encounter medications on file as of 05/13/2022.    Follow-up: No follow-ups on file.   07/13/2022, MD

## 2022-06-24 ENCOUNTER — Ambulatory Visit (INDEPENDENT_AMBULATORY_CARE_PROVIDER_SITE_OTHER): Payer: 59 | Admitting: Family Medicine

## 2022-06-24 ENCOUNTER — Encounter: Payer: Self-pay | Admitting: Family Medicine

## 2022-06-24 ENCOUNTER — Ambulatory Visit: Payer: Self-pay

## 2022-06-24 VITALS — BP 102/60 | Ht 67.0 in | Wt 229.0 lb

## 2022-06-24 DIAGNOSIS — M25476 Effusion, unspecified foot: Secondary | ICD-10-CM

## 2022-06-24 DIAGNOSIS — M25511 Pain in right shoulder: Secondary | ICD-10-CM

## 2022-06-24 DIAGNOSIS — M65811 Other synovitis and tenosynovitis, right shoulder: Secondary | ICD-10-CM | POA: Insufficient documentation

## 2022-06-24 DIAGNOSIS — M722 Plantar fascial fibromatosis: Secondary | ICD-10-CM | POA: Diagnosis not present

## 2022-06-24 MED ORDER — PREDNISONE 5 MG PO TABS: ORAL_TABLET | ORAL | 0 refills | Status: DC

## 2022-06-24 NOTE — Assessment & Plan Note (Signed)
Acutely occurring.  There is effusion noticed in the first MTP joint but does not appear to be inflamed -Counseled on home exercise therapy and supportive care. -Prednisone. -Could consider injection or custom orthotics.

## 2022-06-24 NOTE — Assessment & Plan Note (Signed)
Acutely occurring.  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 -Counseled on home exercise therapy and supportive care. -Green sport insoles. -Midfoot arch strap. -Could consider injection or custom orthotics

## 2022-06-24 NOTE — Progress Notes (Signed)
  Maria Sutton - 26 y.o. female MRN [id number]  Date of birth: 06/29/1996  SUBJECTIVE:  Including CC & ROS.  No chief complaint on file.   Maria Sutton is a 26 y.o. female that is presenting with acute on chronic shoulder pain, left and right foot pain and left first MTP joint pain.  The shoulder pain has been ongoing for years intermittently.  She does have a history of rheumatoid arthritis.  Denies any specific injury.  The pain does respond to ibuprofen.  Left and right foot has been more acutely painful.  The pain is worse in the morning.  Having some tightness and soreness of the left first MTP joint.    Review of Systems See HPI   HISTORY: Past Medical, Surgical, Social, and Family History Reviewed & Updated per EMR.   Pertinent Historical Findings include:  Past Medical History:  Diagnosis Date   ADHD    Anxiety    Depression    Depression    Phreesia 08/07/2020   Headache    Lupus (HCC)    PCOS (polycystic ovarian syndrome)    PTSD (post-traumatic stress disorder)    Rheumatoid arthritis ([place])    Systemic lupus erythematosus ([place])    Thyroid disease    hypothyroid    History reviewed. No pertinent surgical history.   PHYSICAL EXAM:  VS: BP 102/60 (BP Location: Left Arm, Patient Position: Sitting)   Ht 5\' 7"  (1.702 m)   Wt 229 lb (103.9 kg)   BMI 35.87 kg/m  Physical Exam Gen: NAD, alert, cooperative with exam, well-appearing MSK:  Neurovascularly intact    Limited ultrasound: Right shoulder, left foot:  Right shoulder: No change of the biceps tendon. There does appear to be an effusion in the anterior joint with dynamic testing of the subscapularis. Normal-appearing supraspinatus with mild bursitis. No changes in the posterior glenohumeral joint.  Left foot: Normal plantar fascia at the origin. There is thickening at the mid substance of the plantar fascia Effusion of the first MTP joint but no inflammatory changes  Summary: "She will effusion  appreciated in the anterior shoulder joint with plantar fibroma in the left foot with effusion of the first MTP joint.  Ultrasound and interpretation by Clearance Coots, MD    ASSESSMENT & PLAN:   Synovitis of right shoulder Acute on chronic in nature.  Previous x-rays have been normal.  She does have a history of autoimmune process that may be contributing.  She also seems to have component of hypermobility that may be playing a role. -Counseled on home exercise therapy and supportive care. -Prednisone. -Could consider further imaging or injection.  Plantar fascial fibromatosis of both feet Acutely occurring.  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 -Counseled on home exercise therapy and supportive care. -Green sport insoles. -Midfoot arch strap. -Could consider injection or custom orthotics  Effusion of metatarsophalangeal (MTP) joint of great toe Acutely occurring.  There is effusion noticed in the first MTP joint but does not appear to be inflamed -Counseled on home exercise therapy and supportive care. -Prednisone. -Could consider injection or custom orthotics.

## 2022-06-24 NOTE — Patient Instructions (Signed)
Nice to meet you Please try the insoles and wrap  Please try the exercises   Please send me a message in MyChart with any questions or updates.  Please see me back in 4 weeks.   --Dr. Raeford Razor

## 2022-06-24 NOTE — Assessment & Plan Note (Signed)
Acute on chronic in nature.  Previous x-rays have been normal.  She does have a history of autoimmune process that may be contributing.  She also seems to have component of hypermobility that may be playing a role. -Counseled on home exercise therapy and supportive care. -Prednisone. -Could consider further imaging or injection.

## 2022-07-02 ENCOUNTER — Other Ambulatory Visit: Payer: Self-pay | Admitting: Obstetrics and Gynecology

## 2022-07-08 ENCOUNTER — Other Ambulatory Visit: Payer: Self-pay | Admitting: *Deleted

## 2022-07-08 DIAGNOSIS — Z3009 Encounter for other general counseling and advice on contraception: Secondary | ICD-10-CM

## 2022-07-08 MED ORDER — SLYND 4 MG PO TABS: 1.0000 | ORAL_TABLET | Freq: Every day | ORAL | 0 refills | Status: DC

## 2022-07-08 NOTE — Progress Notes (Unsigned)
TC from pt regarding refill [facility]. Pt due for annual exam. 2 refills sent. Pt to schedule annual exam when December appt schedule is open.

## 2022-07-22 ENCOUNTER — Ambulatory Visit (HOSPITAL_BASED_OUTPATIENT_CLINIC_OR_DEPARTMENT_OTHER)
Admission: RE | Admit: 2022-07-22 | Discharge: 2022-07-22 | Disposition: A | Discharge status: Home / Self Care | Payer: 59 | Source: Ambulatory Visit | Attending: Family Medicine | Admitting: Family Medicine

## 2022-07-22 ENCOUNTER — Ambulatory Visit (INDEPENDENT_AMBULATORY_CARE_PROVIDER_SITE_OTHER): Payer: 59 | Admitting: Family Medicine

## 2022-07-22 ENCOUNTER — Encounter: Payer: Self-pay | Admitting: Family Medicine

## 2022-07-22 VITALS — BP 120/80 | Ht 67.0 in | Wt 229.0 lb

## 2022-07-22 DIAGNOSIS — M25476 Effusion, unspecified foot: Secondary | ICD-10-CM | POA: Diagnosis not present

## 2022-07-22 DIAGNOSIS — M722 Plantar fascial fibromatosis: Secondary | ICD-10-CM

## 2022-07-22 DIAGNOSIS — M25475 Effusion, left foot: Secondary | ICD-10-CM | POA: Diagnosis not present

## 2022-07-22 DIAGNOSIS — M79675 Pain in left toe(s): Secondary | ICD-10-CM | POA: Diagnosis not present

## 2022-07-22 MED ORDER — DICLOFENAC SODIUM 2 % EX SOLN: 2.0000 | Freq: Two times a day (BID) | CUTANEOUS | 2 refills | Status: DC

## 2022-07-22 NOTE — Assessment & Plan Note (Signed)
Continues to have pain most in the first MTP joint.  Does have some radiation of the phalanx. -Counseled on home exercise therapy and supportive care. -Provided hammertoe cushion. -Counseled on stabilizing brace. -X-ray -Could consider injection.

## 2022-07-22 NOTE — Progress Notes (Signed)
  Maria Sutton - 26 y.o. female MRN [id number]  Date of birth: 03/10/1996  SUBJECTIVE:  Including CC & ROS.  No chief complaint on file.   Maria Sutton is a 26 y.o. female that is following up for her foot and toe pain.  Still having some pain more in the great toe.  Having less pain in the arch of the left foot.    Review of Systems See HPI   HISTORY: Past Medical, Surgical, Social, and Family History Reviewed & Updated per EMR.   Pertinent Historical Findings include:  Past Medical History:  Diagnosis Date   ADHD    Anxiety    Depression    Depression    Phreesia 08/07/2020   Headache    Lupus (HCC)    PCOS (polycystic ovarian syndrome)    PTSD (post-traumatic stress disorder)    Rheumatoid arthritis ([place])    Systemic lupus erythematosus ([place])    Thyroid disease    hypothyroid    History reviewed. No pertinent surgical history.   PHYSICAL EXAM:  VS: BP 120/80 (BP Location: Left Arm, Patient Position: Sitting)   Ht 5\' 7"  (1.702 m)   Wt 229 lb (103.9 kg)   BMI 35.87 kg/m  Physical Exam Gen: NAD, alert, cooperative with exam, well-appearing MSK:  Neurovascularly intact       ASSESSMENT & PLAN:   Plantar fascial fibromatosis of both feet Has slowly received improvement.  Can wear the midfoot strap intermittently. -Counseled on home exercise therapy and supportive care. -Could consider custom orthotics or physical therapy.  Effusion of metatarsophalangeal (MTP) joint of great toe Continues to have pain most in the first MTP joint.  Does have some radiation of the phalanx. -Counseled on home exercise therapy and supportive care. -Provided hammertoe cushion. -Counseled on stabilizing brace. -X-ray -Could consider injection.

## 2022-07-22 NOTE — Patient Instructions (Signed)
Good to see you Please try the support under the great toe  I will call with the results  Please try the rub on medicine   Please send me a message in MyChart with any questions or updates.  Please see me back in 4-6 weeks.   --Dr. Raeford Razor

## 2022-07-22 NOTE — Assessment & Plan Note (Signed)
Has slowly received improvement.  Can wear the midfoot strap intermittently. -Counseled on home exercise therapy and supportive care. -Could consider custom orthotics or physical therapy.

## 2022-07-24 ENCOUNTER — Telehealth: Payer: Self-pay | Admitting: Family Medicine

## 2022-07-24 NOTE — Telephone Encounter (Signed)
Pt informed of below.  She would like to try custom orthotics. She has OV 08/19/22.

## 2022-07-24 NOTE — Telephone Encounter (Signed)
Left VM for patient. If she calls back please have her speak with a nurse/CMA and inform that her xray is normal. We can consider an injection, custom orthotics for the toe pain.   If any questions then please take the best time and phone number to call and I will try to call her back.   Rosemarie Ax, MD [facility] 07/24/2022, 2:42 PM

## 2022-07-29 ENCOUNTER — Other Ambulatory Visit: Payer: Self-pay

## 2022-07-29 ENCOUNTER — Ambulatory Visit (INDEPENDENT_AMBULATORY_CARE_PROVIDER_SITE_OTHER): Payer: 59 | Admitting: Family Medicine

## 2022-07-29 ENCOUNTER — Encounter: Payer: Self-pay | Admitting: Family Medicine

## 2022-07-29 DIAGNOSIS — Z3009 Encounter for other general counseling and advice on contraception: Secondary | ICD-10-CM

## 2022-07-29 DIAGNOSIS — M25476 Effusion, unspecified foot: Secondary | ICD-10-CM

## 2022-07-29 MED ORDER — SLYND 4 MG PO TABS: 1.0000 | ORAL_TABLET | Freq: Every day | ORAL | 1 refills | Status: DC

## 2022-07-29 NOTE — Progress Notes (Signed)
  Maria Sutton - 26 y.o. female MRN [id number]  Date of birth: 22-Jul-1996  SUBJECTIVE:  Including CC & ROS.  No chief complaint on file.   Maria Sutton is a 26 y.o. female that is  here for orthotics.    Review of Systems See HPI   HISTORY: Past Medical, Surgical, Social, and Family History Reviewed & Updated per EMR.   Pertinent Historical Findings include:  Past Medical History:  Diagnosis Date   ADHD    Anxiety    Depression    Depression    Phreesia 08/07/2020   Headache    Lupus (HCC)    PCOS (polycystic ovarian syndrome)    PTSD (post-traumatic stress disorder)    Rheumatoid arthritis ([place])    Systemic lupus erythematosus ([place])    Thyroid disease    hypothyroid    History reviewed. No pertinent surgical history.   PHYSICAL EXAM:  VS: Ht 5\' 7"  (1.702 m)   Wt 229 lb (103.9 kg)   BMI 35.87 kg/m  Physical Exam Gen: NAD, alert, cooperative with exam, well-appearing MSK:  Neurovascularly intact    Patient was fitted for a standard, cushioned, semi-rigid orthotic. The orthotic was heated and afterward the patient stood on the orthotic blank positioned on the orthotic stand. The patient was positioned in subtalar neutral position and 10 degrees of ankle dorsiflexion in a weight bearing stance. After completion of molding, a stable base was applied to the orthotic blank. The blank was ground to a stable position for weight bearing. Size: 10 Pairs: 2 Base: Blue EVA Additional Posting and Padding: left first ray post  The patient ambulated these, and they were very comfortable.    ASSESSMENT & PLAN:   Effusion of metatarsophalangeal (MTP) joint of great toe Orthotics completed with left first ray post

## 2022-07-29 NOTE — Assessment & Plan Note (Signed)
Orthotics completed with left first ray post

## 2022-08-19 ENCOUNTER — Ambulatory Visit (INDEPENDENT_AMBULATORY_CARE_PROVIDER_SITE_OTHER): Payer: 59 | Admitting: Family Medicine

## 2022-08-19 VITALS — BP 130/88 | Ht 67.0 in | Wt 220.0 lb

## 2022-08-19 DIAGNOSIS — M25476 Effusion, unspecified foot: Secondary | ICD-10-CM | POA: Diagnosis not present

## 2022-08-19 NOTE — Progress Notes (Signed)
  Maria Sutton - 26 y.o. female MRN [id number]  Date of birth: [birth year]  SUBJECTIVE:  Including CC & ROS.  No chief complaint on file.   Maria Sutton is a 26 y.o. female that is presenting with acute worsening of her left first MTP joint.  She has tried different medications as orthotics.  Continues to have pain in the toe.    Review of Systems See HPI   HISTORY: Past Medical, Surgical, Social, and Family History Reviewed & Updated per EMR.   Pertinent Historical Findings include:  Past Medical History:  Diagnosis Date   ADHD    Anxiety    Depression    Depression    Phreesia 08/07/2020   Headache    Lupus (HCC)    PCOS (polycystic ovarian syndrome)    PTSD (post-traumatic stress disorder)    Rheumatoid arthritis (HCC)    Systemic lupus erythematosus (HCC)    Thyroid disease    hypothyroid    No past surgical history on file.   PHYSICAL EXAM:  VS: BP 130/88   Ht 5\' 7"  (1.702 m)   Wt 220 lb (99.8 kg)   BMI 34.46 kg/m  Physical Exam Gen: NAD, alert, cooperative with exam, well-appearing MSK:  Left toe:  Tenderness to palpation at the left first MTP joint. Limited flexion and extension of the first MTP joint. Instability of the first MTP joint. Neurovascularly intact       ASSESSMENT & PLAN:   Effusion of metatarsophalangeal (MTP) joint of great toe Acute on chronic in nature.  Continues to have pain at the joint space.  She has been under greater than 6 weeks of physician directed home exercise therapy.  We have tried orthotics with no improvement.  Imaging was unrevealing. -Counseled on home exercise therapy and supportive care. -MRI of the left foot focusing on the first MTP joint to evaluate for capsular tear and for presurgical planning.

## 2022-08-19 NOTE — Assessment & Plan Note (Signed)
Acute on chronic in nature.  Continues to have pain at the joint space.  She has been under greater than 6 weeks of physician directed home exercise therapy.  We have tried orthotics with no improvement.  Imaging was unrevealing. -Counseled on home exercise therapy and supportive care. -MRI of the left foot focusing on the first MTP joint to evaluate for capsular tear and for presurgical planning.

## 2022-08-19 NOTE — Patient Instructions (Signed)
Good to see you Please try voltaren over the counter  [facility]'ll [facility] schedule [facility] in [employer]  Please send me a message in MyChart with any questions or updates.  We'll setup a virtual visit once the MRi is resulted.   --Dr. Jordan Likes

## 2022-08-28 ENCOUNTER — Other Ambulatory Visit: Payer: Self-pay | Admitting: *Deleted

## 2022-08-28 DIAGNOSIS — Z3009 Encounter for other general counseling and advice on contraception: Secondary | ICD-10-CM

## 2022-08-28 MED ORDER — SLYND 4 MG PO TABS: 1.0000 | ORAL_TABLET | Freq: Every day | ORAL | 0 refills | Status: DC

## 2022-08-28 NOTE — Progress Notes (Signed)
Refill on [facility] sent to pharmacy.  Pt is scheduled for AEX in December.

## 2022-09-14 ENCOUNTER — Ambulatory Visit (INDEPENDENT_AMBULATORY_CARE_PROVIDER_SITE_OTHER): Payer: 59

## 2022-09-14 DIAGNOSIS — M25475 Effusion, left foot: Secondary | ICD-10-CM

## 2022-09-14 DIAGNOSIS — M79675 Pain in left toe(s): Secondary | ICD-10-CM | POA: Diagnosis not present

## 2022-09-14 DIAGNOSIS — G8929 Other chronic pain: Secondary | ICD-10-CM

## 2022-09-14 DIAGNOSIS — M25476 Effusion, unspecified foot: Secondary | ICD-10-CM

## 2022-09-16 ENCOUNTER — Encounter: Payer: Self-pay | Admitting: Student

## 2022-09-16 ENCOUNTER — Ambulatory Visit (INDEPENDENT_AMBULATORY_CARE_PROVIDER_SITE_OTHER): Payer: 59 | Admitting: Student

## 2022-09-16 ENCOUNTER — Encounter: Payer: Self-pay | Admitting: Family Medicine

## 2022-09-16 VITALS — BP 116/72 | HR 79 | Ht 67.0 in | Wt 223.0 lb

## 2022-09-16 DIAGNOSIS — Z01419 Encounter for gynecological examination (general) (routine) without abnormal findings: Secondary | ICD-10-CM

## 2022-09-16 DIAGNOSIS — N913 Primary oligomenorrhea: Secondary | ICD-10-CM | POA: Diagnosis not present

## 2022-09-16 DIAGNOSIS — Z3009 Encounter for other general counseling and advice on contraception: Secondary | ICD-10-CM | POA: Diagnosis not present

## 2022-09-16 NOTE — Patient Instructions (Addendum)
Spironolactone Tablets What is this medication? SPIRONOLACTONE (speer on oh LAK tone) treats high blood pressure and heart failure. It may also be used to reduce swelling related to heart, kidney, or liver disease. It helps your kidneys remove more fluid and salt from your blood through the urine without losing too much potassium. It belongs to a group of medications called diuretics. This medicine may be used for other purposes; ask your health care provider or pharmacist if you have questions. HPV (human papillomavirus) vaccine can prevent infection with some types of human papillomavirus. HPV infections can cause certain types of cancers, including: cervical, vaginal, and vulvar cancers in women penile cancer in men anal cancers in both men and women cancers of tonsils, base of tongue, and back of throat (oropharyngeal cancer) in both men and women HPV infections can also cause anogenital warts. HPV vaccine can prevent over 90% of cancers caused by HPV. HPV is spread through intimate skin-to-skin or sexual contact. HPV infections are so common that nearly all people will get at least one type of HPV at some time in their lives. Most HPV infections go away on their own within 2 years. But sometimes HPV infections will last longer and can cause cancers later in life. 2. HPV vaccine HPV vaccine is routinely recommended for adolescents at 57 or 25 years of age to ensure they are protected before they are exposed to the virus. HPV vaccine may be given beginning at age 64 years and vaccination is recommended for everyone through 25 years of age. HPV vaccine may be given to adults 27 through 25 years of age, based on discussions between the patient and health care provider. Most children who get the first dose before 49 years of age need 2 doses of HPV vaccine. People who get the first dose at or after 71 years of age and younger people with certain immunocompromising conditions need 3 doses. Your  health care provider can give you more information. HPV vaccine may be given at the same time as other vaccines. 3. Talk with your health care provider Tell your vaccination provider if the person getting the vaccine: Has had an allergic reaction after a previous dose of HPV vaccine, or has any severe, life-threatening allergies Is pregnant--HPV vaccine is not recommended until after pregnancy In some cases, your health care provider may decide to postpone HPV vaccination until a future visit. People with minor illnesses, such as a cold, may be vaccinated. People who are moderately or severely ill should usually wait until they recover before getting HPV vaccine. Your health care provider can give you more information. 4. Risks of a vaccine reaction Soreness, redness, or swelling where the shot is given can happen after HPV vaccination. Fever or headache can happen after HPV vaccination. People sometimes faint after medical procedures, including vaccination. Tell your provider if you feel dizzy or have vision changes or ringing in the ears. As with any medicine, there is a very remote chance of a vaccine causing a severe allergic reaction, other serious injury, or death. 5. What if there is a serious problem? An allergic reaction could occur after the vaccinated person leaves the clinic. If you see signs of a severe allergic reaction (hives, swelling of the face and throat, difficulty breathing, a fast heartbeat, dizziness, or weakness), call 9-1-1 and get the person to the nearest hospital. For other signs that concern you, call your health care provider. Adverse reactions should be reported to the Vaccine Adverse Event Reporting System (  VAERS). Your health care provider will usually file this report, or you can do it yourself. Visit the VAERS website at www.vaers.LAgents.no or call 870 263 9399. VAERS is only for reporting reactions, and VAERS staff members do not give medical advice. 6. The  National Vaccine Injury Compensation Program The [employer] Vaccine Injury Compensation Program (VICP) is a federal program that was created to compensate people who may have been injured by certain vaccines. Claims regarding alleged injury or death due to vaccination have a time limit for filing, which may be as short as two years. Visit the VICP website at SpiritualWord.at or call (562)810-2880 to learn about the program and about filing a claim. 7. How can I learn more? Ask your health care provider. Call your local or state health department. Visit the website of the Food and Drug Administration (FDA) for vaccine package inserts and additional information at FinderList.no. Contact the Centers for Disease Control and Prevention (CDC): Call 775-107-9916 (1-800-CDC-INFO) or Visit CDC's website at PicCapture.uy. Source: CDC Vaccine Information Statement HPV Vaccine (05/12/2020) This same material is available at FootballExhibition.com.br for no charge. This information is not intended to replace advice given to you by your health care provider. Make sure you discuss any questions you have with your health care provider. Document Revised: 08/20/2021 Document Reviewed: 06/14/2021 Elsevier Patient Education  2023 Elsevier Inc. Oral Contraception Information Oral contraceptive pills (OCPs) are medicines taken by mouth to prevent pregnancy. They work by: Preventing the ovaries from releasing eggs. Thickening mucus in the lower part of the uterus (cervix). This prevents sperm from entering the uterus. Thinning the lining of the uterus (endometrium). This prevents a fertilized egg from attaching to the endometrium. OCPs are highly effective when taken exactly as prescribed. However, OCPs do not prevent STIs (sexually transmitted infections). Using condoms while on an OCP can help prevent STIs. What happens before starting OCPs? Before you start taking OCPs: You  may have a physical exam, blood test, and Pap test. Your health care provider will make sure you are a good candidate for oral contraception. OCPs are not a good option for certain women, such as: Women who smoke and are older than age 17. Women who have or have had certain conditions, such as: A history of high blood pressure. Deep vein thrombosis. Pulmonary embolism. Stroke. Cardiovascular disease. Peripheral vascular disease. Ask your health care provider about the possible side effects of the OCP you may be prescribed. Be aware that it can take 2-3 months for your body to adjust to changes in hormone levels. Types of oral contraception  Birth control pills contain the hormones estrogen and progestin (synthetic progesterone) or progestin only. The combination pill This type of pill contains estrogen and progestin hormones. Conventional contraception pills come in packs of 21 or 28 pills. Some packs with 28-day pills contain estrogen and progestin for the first 21-24 days. Hormone-free tablets, called placebos, are taken for the final 4-7 days. You should have menstrual bleeding during the time you take the placebos. In packs with 21 tablets, you take no pills for 7 days. Menstrual bleeding occurs during these days. (Some people prefer taking a pill for 28 days to help establish a routine). Extended-interval contraception pills come in packs of 91 pills. The first 84 tablets have both estrogen and progestin. The last 7 pills are placebos. Menstrual bleeding occurs during the placebo days. With this schedule, menstrual bleeding happens once every 3 months. Continuous contraception pills come in packs of 28 pills. All pills in the  pack contain estrogen and progestin. With this schedule, regular menstrual bleeding does not happen, but there may be spotting or irregular bleeding. Progestin-only pills This type of pill is often called the mini-pill and contains the progestin hormone only. It comes  in packs of 28 pills. In some packs, the last 4 pills are placebos. The pill must be taken at the same time every day. This is very important to prevent pregnancy. Menstrual bleeding may not be regular or predictable. What are the advantages? Oral contraception provides reliable and continuous contraception if taken as directed. It may treat or decrease symptoms of: Menstrual period cramps. Irregular menstrual cycle or bleeding. Heavy menstrual flow. Abnormal uterine bleeding. Acne, depending on the type of pill. Polycystic ovarian syndrome (POS). Endometriosis. Iron deficiency anemia. Premenstrual symptoms, including severe irritability, depression, or anxiety. It also may: Reduce the risk of endometrial and ovarian cancer. Be used as emergency contraception. Prevent ectopic pregnancies and infections of the fallopian tubes. What can make OCPs less effective? OCPs may be less effective if: You forget to take the pill every day. For progestin-only pills, it is especially important to take the pill at the same time each day. Even taking it 3 hours late can increase the risk of pregnancy. You have a stomach or intestinal disease that reduces your body's ability to absorb the pill. You take OCPs with other medicines that make OCPs less effective, such as antibiotics, certain HIV medicines, and some seizure medicines. You take expired OCPs. You forget to restart the pill after 7 days of not taking it. This refers to the packs of 21 pills. What are the side effects and risks? OCPs can sometimes cause side effects, such as: Headache. Depression. Trouble sleeping. Nausea and vomiting. Breast tenderness. Irregular bleeding or spotting during the first several months. Bloating or fluid retention. Increase in blood pressure. Combination pills may slightly increase the risk of: Blood clots. Heart attack. Stroke. Follow these instructions at home: Follow instructions from your health care  provider about how to start taking your first cycle of OCPs. Depending on when you start the pill, you may need to use a backup form of birth control, such as condoms, during the first week. Make sure you know what steps to take if you forget to take the pill. Summary Oral contraceptive pills (OCPs) are medicines taken by mouth to prevent pregnancy. They are highly effective when taken exactly as prescribed. OCPs contain a combination of the hormones estrogen and progestin (synthetic progesterone) or progestin only. Before you start taking the pill, you may have a physical exam, blood test, and Pap test. Your health care provider will make sure you are a good candidate for oral contraception. The combination pill may come in a 21-day pack, a 28-day pack, or a 91-day pack. Progestin-only pills come in packs of 28 pills. OCPs can sometimes cause side effects, such as headache, nausea, breast tenderness, or irregular bleeding. This information is not intended to replace advice given to you by your health care provider. Make sure you discuss any questions you have with your health care provider. Document Revised: 06/23/2020 Document Reviewed: 06/01/2020 Elsevier Patient Education  2023 [employer].

## 2022-09-16 NOTE — Progress Notes (Unsigned)
ANNUAL EXAM Patient name: Maria Sutton MRN [id number]  Date of birth: Mar 17, 1996 Chief Complaint:   Painful and irregular periods.  History of Present Illness:   Maria Sutton is a 26 y.o. No obstetric history on file. Hispanic female being seen today for a routine annual exam.  Current complaints: Painful periods since onset at 26 years old. Unwanted fertility. Abnormal hair growth and "female-patterned body shape". Patient's cycle and physical features add to saddened feelings. Patient is exhausted from managing lupus and pcos symptoms. Family history of PCOS and similar symptoms are contributing to their desire to not have offspring.  Not currently sexually active.   Patient's last menstrual period was 04/24/2022.  Upstream - 09/16/22 0830       Pregnancy Intention Screening   Does the patient want to become pregnant in the next year? No    Does the patient's partner want to become pregnant in the next year? No    Would the patient like to discuss contraceptive options today? No            The pregnancy intention screening data noted above was reviewed. Potential methods of contraception were discussed. The patient elected to proceed with continued POPs.  Last pap 08/2020. Results were: NILM w/ HRHPV negative. H/O abnormal pap: no/ Last mammogram: never. Last colonoscopy: never.      09/16/2022    8:25 AM 05/13/2022    9:01 AM 08/15/2020   11:14 AM 08/10/2020   11:25 AM  Depression screen PHQ 2/9  Decreased Interest 1 1 0 0  Down, Depressed, Hopeless 1 1 0 0  PHQ - 2 Score 2 2 0 0  Altered sleeping 1 1    Tired, decreased energy 1 1    Change in appetite 1 0    Feeling bad or failure about yourself  1 1    Trouble concentrating 1 1    Moving slowly or fidgety/restless 0 0    Suicidal thoughts 0 0    PHQ-9 Score 7 6    Difficult doing work/chores  Not difficult at all          09/16/2022    8:27 AM  GAD 7 : Generalized Anxiety Score  Nervous, Anxious, on Edge 1   Control/stop worrying 0  Worry too much - different things 1  Trouble relaxing 1  Restless 1  Easily annoyed or irritable 1  Afraid - awful might happen 0  Total GAD 7 Score 5  Anxiety Difficulty Somewhat difficult     Review of Systems:   Pertinent items are noted in HPI Denies any headaches, blurred vision, fatigue, shortness of breath, chest pain, abdominal pain, abnormal vaginal discharge/itching/odor/irritation, problems with periods, bowel movements, urination, or intercourse unless otherwise stated above. Pertinent History Reviewed:  Reviewed past medical,surgical, social and family history.  Reviewed problem list, medications and allergies. Physical Assessment:   Vitals:   09/16/22 0821  BP: 116/72  Pulse: 79  Weight: 223 lb (101.2 kg)  Height: 5\' 7"  (1.702 m)  Body mass index is 34.93 kg/m.        Physical Examination:   General appearance - well appearing, and in no distress  Mental status - alert, oriented to person, place, and time  Psych:  She has a normal mood and affect  Skin - warm and dry, normal color, no suspicious lesions noted  Chest - effort normal, all lung fields clear to auscultation bilaterally  Heart - normal rate and regular rhythm  Neck:  midline trachea, no thyromegaly or nodules  Abdomen - soft, nontender, nondistended, no masses or organomegaly  Pelvic - Thin prep pap is not done   Extremities:  No swelling or varicosities noted  Chaperone present for exam  Results for orders placed or performed in visit on 09/16/22 (from the past 24 hour(s))  Prolactin   Collection Time: 09/16/22  9:15 AM  Result Value Ref Range   Prolactin 17.4 4.8 - 23.3 ng/mL  TSH Rfx on Abnormal to Free T4   Collection Time: 09/16/22  9:15 AM  Result Value Ref Range   TSH 0.999 0.450 - 4.500 uIU/mL  Hemoglobin A1c   Collection Time: 09/16/22  9:15 AM  Result Value Ref Range   Hgb A1c MFr Bld 5.3 4.8 - 5.6 %   Est. average glucose Bld gHb Est-mCnc 105 mg/dL   CBC   Collection Time: 09/16/22  9:15 AM  Result Value Ref Range   WBC 6.2 3.4 - 10.8 x10E3/uL   RBC 5.63 (H) 3.77 - 5.28 x10E6/uL   Hemoglobin 15.7 11.1 - 15.9 g/dL   Hematocrit 45.5 34.0 - 46.6 %   MCV 81 79 - 97 fL   MCH 27.9 26.6 - 33.0 pg   MCHC 34.5 31.5 - 35.7 g/dL   RDW 11.8 11.7 - 15.4 %   Platelets 284 150 - 450 x10E3/uL    Assessment & Plan:  1. Women's annual routine gynecological examination - Declined HPV vaccine - Content with SLYND, reordered. Discussed the potential for irregular bleeding and spotting.  - Hemoglobin A1c - diabetes screening - CBC - anemia screening  2. Primary oligomenorrhea - Prolactin - TSH Rfx on Abnormal to Free T4 - Hemoglobin A1c - CBC  3. Unwanted fertility - Addressed concerns. Discussed that likely would be offered a tubal ligation when patient is ready to move forward. Discussed 30 day eligibility criteria from time of consent for any federal assisted medical coverage.  - Offered behavioral health support, patient does not desire at this time - Counseled on use of EC if situation arises that patient is concerned for pregnancy( i.e. missed pills and/or unprotected sexual intercourse)    Mammogram: @ 25yo, or sooner if problems Colonoscopy: @ 25yo, or sooner if problems  Orders Placed This Encounter  Procedures   Prolactin   TSH Rfx on Abnormal to Free T4   Hemoglobin A1c   CBC    Meds:  Meds ordered this encounter  Medications   Drospirenone (SLYND) 4 MG TABS    Sig: Take 1 tablet by mouth daily.    Dispense:  28 tablet    Refill:  11    Order Specific Question:   Supervising Provider    Answer:   Donnamae Jude [[id number]]   levonorgestrel (PLAN B 1-STEP) 1.5 MG tablet    Sig: Take 1 tablet (1.5 mg total) by mouth once for 1 dose.    Dispense:  1 tablet    Refill:  0    Order Specific Question:   Supervising Provider    Answer:   Donnamae Jude [id number]    Follow-up: Return in about 1 year (around 09/17/2023), or if  symptoms worsen or fail to improve, for ANN.  Johnston Ebbs, NP 09/17/2022 7:37 AM

## 2022-09-16 NOTE — Progress Notes (Unsigned)
Patient presents for AEX.  Patient has elevated PHQ9 and GAD 7 score. Declines referral. Denies having any vaginal issues at this time. Taking Slynd for birth control. Declines flu vaccine  Last pap: 08/15/20 Normal

## 2022-09-17 LAB — CBC
Hematocrit: 45.5 % (ref 34.0–46.6)
Hemoglobin: 15.7 g/dL (ref 11.1–15.9)
MCH: 27.9 pg (ref 26.6–33.0)
MCHC: 34.5 g/dL (ref 31.5–35.7)
MCV: 81 fL (ref 79–97)
Platelets: 284 10*3/uL (ref 150–450)
RBC: 5.63 x10E6/uL — ABNORMAL HIGH (ref 3.77–5.28)
RDW: 11.8 % (ref 11.7–15.4)
WBC: 6.2 10*3/uL (ref 3.4–10.8)

## 2022-09-17 LAB — HEMOGLOBIN A1C
Est. average glucose Bld gHb Est-mCnc: 105 mg/dL
Hgb A1c MFr Bld: 5.3 % (ref 4.8–5.6)

## 2022-09-17 LAB — PROLACTIN: Prolactin: 17.4 ng/mL (ref 4.8–23.3)

## 2022-09-17 LAB — TSH RFX ON ABNORMAL TO FREE T4: TSH: 0.999 u[IU]/mL (ref 0.450–4.500)

## 2022-09-17 MED ORDER — LEVONORGESTREL 1.5 MG PO TABS: 1.5000 mg | ORAL_TABLET | Freq: Once | ORAL | 0 refills | Status: AC

## 2022-09-17 MED ORDER — SLYND 4 MG PO TABS: 1.0000 | ORAL_TABLET | Freq: Every day | ORAL | 11 refills | Status: DC

## 2022-09-18 ENCOUNTER — Encounter: Payer: Self-pay | Admitting: Family Medicine

## 2022-09-18 ENCOUNTER — Telehealth (INDEPENDENT_AMBULATORY_CARE_PROVIDER_SITE_OTHER): Payer: 59 | Admitting: Family Medicine

## 2022-09-18 ENCOUNTER — Encounter: Payer: Self-pay | Admitting: Internal Medicine

## 2022-09-18 VITALS — Ht 67.0 in | Wt 220.0 lb

## 2022-09-18 DIAGNOSIS — M25476 Effusion, unspecified foot: Secondary | ICD-10-CM

## 2022-09-18 DIAGNOSIS — M25475 Effusion, left foot: Secondary | ICD-10-CM | POA: Diagnosis not present

## 2022-09-18 NOTE — Progress Notes (Signed)
Virtual Visit via Video Note  I connected with Maria Sutton on 09/18/22 at  8:00 AM EST by a video enabled telemedicine application and verified that I am speaking with the correct person using two identifiers.  Location: Patient: vehicle Provider: office   I discussed the limitations of evaluation and management by telemedicine and the availability of in person appointments. The patient expressed understanding and agreed to proceed.  History of Present Illness:  Maria Sutton is a 26 yo F that is following up after her MRI of her left foot. This is demonstrating a first MTP joint effusion with marrow edema and marginal erosions.   Observations/Objective:   Assessment and Plan:  Effusion of 1st MTP joint of left foot:  MRI is showing effusion, marrow edema and erosions. She has a history of RA which is contributing.  - counseled on supportive care - could consider injection.   Follow Up Instructions:    I discussed the assessment and treatment plan with the patient. The patient was provided an opportunity to ask questions and all were answered. The patient agreed with the plan and demonstrated an understanding of the instructions.   The patient was advised to call back or seek an in-person evaluation if the symptoms worsen or if the condition fails to improve as anticipated.    Maria Gandy, MD

## 2022-09-18 NOTE — Assessment & Plan Note (Signed)
MRI is showing effusion, marrow edema and erosions. She has a history of RA which is contributing.  - counseled on supportive care - could consider injection.

## 2022-09-19 ENCOUNTER — Encounter: Payer: Self-pay | Admitting: *Deleted

## 2022-09-19 NOTE — Telephone Encounter (Signed)
The joint erosion on MRI is damage typical for rheumatoid arthritis. Hydroxyxchloroquine is probably not preventing her inflammation well enough if she is still getting joint damage. We need to schedule a follow up appointment to talk about changing this plan.  Is she having a lot of pain still? I could prescribe a course of prednisone to try and improve swelling and pain in the meantime if we can't schedule her soon.  Her recent lab results look okay to me overall.

## 2022-09-22 IMAGING — DX DG CHEST 1V PORT
1 series · 1 of 1 positions shown · non-contrast
Comparison: 07/26/2020

CLINICAL DATA: Cough

EXAM:
PORTABLE CHEST 1 VIEW

[chest ap]
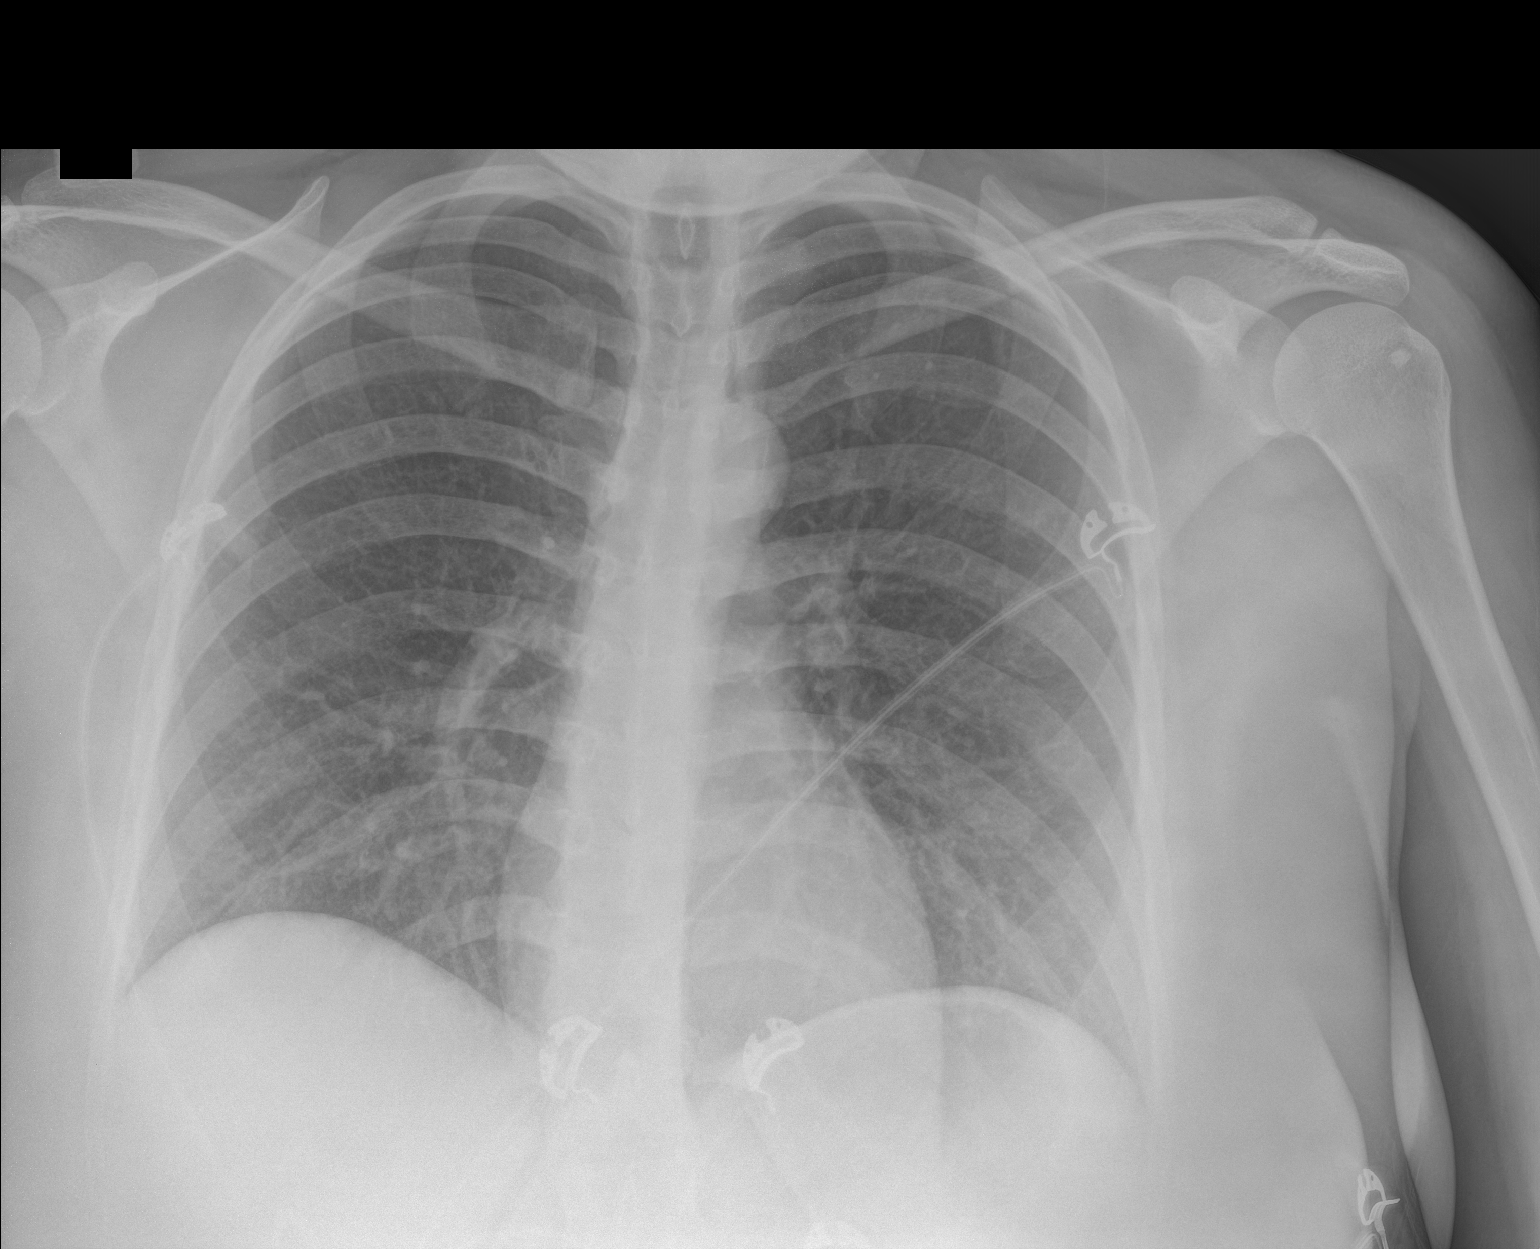

[1 of 1 positions shown; findings below may reference images not displayed]

FINDINGS: The heart size and mediastinal contours are within normal limits.
Both lungs are clear. The visualized skeletal structures are
unremarkable.
IMPRESSION: Negative.

## 2022-09-22 NOTE — Telephone Encounter (Signed)
I'm okay schedule her for virtual visit. Since we will be discussing treatment very soon I'll hold off on any steroid prescription or not until afterwards.

## 2022-09-24 ENCOUNTER — Encounter: Payer: Self-pay | Admitting: Internal Medicine

## 2022-09-24 ENCOUNTER — Ambulatory Visit: Payer: 59 | Attending: Internal Medicine | Admitting: Internal Medicine

## 2022-09-24 ENCOUNTER — Other Ambulatory Visit: Payer: Self-pay | Admitting: Internal Medicine

## 2022-09-24 VITALS — Ht 67.0 in

## 2022-09-24 DIAGNOSIS — M25476 Effusion, unspecified foot: Secondary | ICD-10-CM | POA: Diagnosis not present

## 2022-09-24 DIAGNOSIS — M25512 Pain in left shoulder: Secondary | ICD-10-CM

## 2022-09-24 DIAGNOSIS — Z79899 Other long term (current) drug therapy: Secondary | ICD-10-CM | POA: Diagnosis not present

## 2022-09-24 DIAGNOSIS — G8929 Other chronic pain: Secondary | ICD-10-CM | POA: Diagnosis not present

## 2022-09-24 DIAGNOSIS — M0579 Rheumatoid arthritis with rheumatoid factor of multiple sites without organ or systems involvement: Secondary | ICD-10-CM

## 2022-09-24 MED ORDER — METHOTREXATE SODIUM 2.5 MG PO TABS: 15.0000 mg | ORAL_TABLET | ORAL | 1 refills | Status: DC

## 2022-09-24 MED ORDER — FOLIC ACID 1 MG PO TABS: 1.0000 mg | ORAL_TABLET | Freq: Every day | ORAL | 0 refills | Status: DC

## 2022-09-24 MED ORDER — PREDNISONE 5 MG PO TABS: ORAL_TABLET | ORAL | 0 refills | Status: AC

## 2022-09-24 NOTE — Progress Notes (Signed)
Telemedicine Visit Note  Patient: Maria Sutton             Date of Birth: [birth year]           MRN: [id number]             PCP: Jeani Sow, MD Referring: Jeani Sow, MD Visit Date: 09/24/2022   Subjective:  Follow-up (Doing good)   History of Present Illness: Maria Sutton is a 26 y.o. female meeting today by video chat for telemedicine follow up for seropositive rheumatoid arthritis on hydroxychloroquine 200 mg daily.  She recently developed increased pain and swelling at the right great toe this was evaluated by sports medicine with x-ray and subsequent MRI demonstrating medial border joint erosion changes and active synovitis.  This area continues to be painful daily despite trying orthotics and topical NSAIDs although not quite as severe as the initial onset.  Otherwise she has noticed some increase again of the left shoulder pain that has been an intermittent problem.  This time she has been monitoring and noticed the pain increase association alongside the same time as her great toe swelling.   Visit is being conducted today via video chat between myself at the [facility] health rheumatology office in [place] and patient is joining from at home in [facility].  Review of Systems  Constitutional:  Negative for fatigue.  HENT:  Negative for mouth sores and mouth dryness.   Eyes:  Negative for dryness.  Respiratory:  Negative for shortness of breath.   Cardiovascular:  Negative for chest pain and palpitations.  Gastrointestinal:  Negative for blood in stool, constipation and diarrhea.  Endocrine: Negative for increased urination.  Genitourinary:  Negative for involuntary urination.  Musculoskeletal:  Negative for joint pain, gait problem, joint pain, joint swelling, myalgias, muscle weakness, morning stiffness, muscle tenderness and myalgias.  Skin:  Negative for color change, rash, hair loss and sensitivity to sunlight.  Allergic/Immunologic: Negative for susceptible to  infections.  Neurological:  Negative for dizziness and headaches.  Hematological:  Negative for swollen glands.  Psychiatric/Behavioral:  Positive for sleep disturbance. Negative for depressed mood. The patient is not nervous/anxious.     PMFS History:  Patient Active Problem List   Diagnosis Date Noted   Pain in left shoulder 09/24/2022   High risk medication use 09/24/2022   Synovitis of right shoulder 06/24/2022   Plantar fascial fibromatosis of both feet 06/24/2022   Effusion of metatarsophalangeal (MTP) joint of great toe 06/24/2022   Pain in right shoulder 09/10/2021   Rheumatoid arthritis with rheumatoid factor of multiple sites without organ or systems involvement (HCC) 08/28/2020   Positive ANA (antinuclear antibody) 08/22/2020   Vitamin D deficiency 03/19/2019   Obesity with body mass index (BMI) of 30.0 to 39.9 03/19/2019   Reactive depression 09/25/2018   Migraine without aura and without status migrainosus, not intractable 09/25/2018   Cognitive complaints 09/25/2018    Past Medical History:  Diagnosis Date   ADHD    Anxiety    Depression    Depression    Phreesia 08/07/2020   Headache    Lupus (HCC)    PCOS (polycystic ovarian syndrome)    PTSD (post-traumatic stress disorder)    Rheumatoid arthritis (HCC)    Systemic lupus erythematosus (HCC)    Thyroid disease    hypothyroid    Family History  Problem Relation Age of Onset   Hypertension Mother    Breast cancer Other    No past surgical history  on file. Social History   Social History Narrative   Lives with mom and dad   Right handed   Drinks 1 cup caffeine daily      Dennie Bible great aunt an others w/breast ca    There is no immunization history on file for this patient.   Objective: Vital Signs: Ht 5\' 7"  (1.702 m)   LMP 04/24/2022   BMI 34.46 kg/m    Physical Exam Pulmonary:     Effort: Pulmonary effort is normal.  Neurological:     Mental Status: She is alert.  Psychiatric:        Mood  and Affect: Mood normal.      Musculoskeletal Exam:  Shoulders, elbows, wrists, fingers full range of motion and no obviously visible swelling or erythema  Investigation: No additional findings.  Imaging: MR FOOT LEFT WO CONTRAST  Result Date: 09/16/2022 CLINICAL DATA:  Chronic left great toe pain for the past 3 months. Remote injury 6 years ago. No prior surgery. EXAM: MRI OF THE LEFT FOOT WITHOUT CONTRAST TECHNIQUE: Multiplanar, multisequence MR imaging of the left forefoot was performed. No intravenous contrast was administered. COMPARISON:  Left great toe x-rays dated July 22, 2022. FINDINGS: Bones/Joint/Cartilage Small marginal erosions of the medial first metatarsal head and medial first proximal phalanx base (series 9, image 12) with associated marrow edema. Moderate first MTP joint effusion with mild synovitis. No fracture or dislocation. Joint spaces are preserved. Small amount of fluid in the first intermetatarsal bursa. Ligaments Collateral ligaments are intact. Muscles and Tendons Flexor and extensor tendons are intact. No tenosynovitis. Soft tissue No fluid collection or hematoma.  No soft tissue mass. IMPRESSION: 1. Constellation of findings involving the first MTP joint are most consistent with inflammatory arthropathy. Electronically Signed   By: July 24, 2022 M.D.   On: 09/16/2022 13:29    Recent Labs: Lab Results  Component Value Date   WBC 6.2 09/16/2022   HGB 15.7 09/16/2022   PLT 284 09/16/2022   NA 139 03/28/2022   K 4.2 03/28/2022   CL 104 03/28/2022   CO2 23 03/28/2022   GLUCOSE 76 03/28/2022   BUN 7 03/28/2022   CREATININE 0.73 03/28/2022   BILITOT 0.5 06/25/2021   ALKPHOS 63 06/25/2021   AST 13 (L) 06/25/2021   ALT 13 06/25/2021   PROT 7.3 06/25/2021   ALBUMIN 4.1 06/25/2021   CALCIUM 9.5 03/28/2022   GFRAA 137 08/10/2020    Speciality Comments: PLQ Eye Exam- [facility] 02/2021  Procedures:  No procedures performed Allergies: Other    Assessment / Plan:     Visit Diagnoses: Rheumatoid arthritis with rheumatoid factor of multiple sites without organ or systems involvement (HCC) - Plan: methotrexate (RHEUMATREX) 2.5 MG tablet, folic acid (FOLVITE) 1 MG tablet  Symptoms are not well-controlled now on the hydroxychloroquine and especially with erosive joint changes needs escalation of treatment.  Will plan to start methotrexate 15 mg p.o. weekly and folic acid 1 mg daily.  Since she is having active inflammation with joint damage and methotrexate will take some time for full effectiveness prescribe prednisone taper over next 12 days starting at 20 mg.  Chronic left shoulder pain  Ongoing intermittent pain often associated with activity or increased use but it be more correlated with peripheral joint synovitis now more suggestive for RA disease activity component.  High risk medication use  Discussed risks of methotrexate including GI intolerance, cytopenias, hepatotoxicity, infection, and teratogenicity.  Discussed need to severely limit or entirely avoid  alcohol while on treatment and need for regular follow-up for laboratory test monitoring.  She is currently using oral contraceptive and has no plans for pregnancy in the near future.  Is considering tubal ligation or other more durable form of sterilization or contraception in the future.  Orders: No orders of the defined types were placed in this encounter.  Meds ordered this encounter  Medications   methotrexate (RHEUMATREX) 2.5 MG tablet    Sig: Take 6 tablets (15 mg total) by mouth once a week. Caution:Chemotherapy. Protect from light.    Dispense:  30 tablet    Refill:  1   folic acid (FOLVITE) 1 MG tablet    Sig: Take 1 tablet (1 mg total) by mouth daily.    Dispense:  90 tablet    Refill:  0   predniSONE (DELTASONE) 5 MG tablet    Sig: Take 4 tablets (20 mg total) by mouth daily with breakfast for 3 days, THEN 3 tablets (15 mg total) daily with breakfast for 3  days, THEN 2 tablets (10 mg total) daily with breakfast for 3 days, THEN 1 tablet (5 mg total) daily with breakfast for 3 days.    Dispense:  30 tablet    Refill:  0     Follow-Up Instructions: No follow-ups on file.   Fuller Plan, MD  The total time spent during this video telemedicine encounter including a significant component of patient counseling on high risk medication use was approximately 16 minutes.

## 2022-10-22 ENCOUNTER — Encounter: Payer: Self-pay | Admitting: Internal Medicine

## 2022-10-22 DIAGNOSIS — M0579 Rheumatoid arthritis with rheumatoid factor of multiple sites without organ or systems involvement: Secondary | ICD-10-CM

## 2022-10-23 MED ORDER — DICLOFENAC SODIUM 75 MG PO TBEC: 75.0000 mg | DELAYED_RELEASE_TABLET | Freq: Two times a day (BID) | ORAL | 0 refills | Status: DC | PRN

## 2022-10-23 MED ORDER — FOLIC ACID 1 MG PO TABS: 1.0000 mg | ORAL_TABLET | Freq: Every day | ORAL | 0 refills | Status: DC

## 2022-10-23 NOTE — Telephone Encounter (Signed)
I spoke with Maria Sutton she started having pain again just 2 days ago most noticeable at night, this had improved while on the prednisone treatment in December and until now. Gets partial relief with ibuprofen 800 mg and no benefit with tylenol. I recommend we try oral diclofenac 75 mg for this problem short term to see if we can avoid longer term prednisone. She is concerned about liver toxicity I do not think there is a major risk for this with short term higher dose NSAID use. If not improving within a few days would plan to try prednisone 5 mg daily until we follow up.

## 2022-10-24 ENCOUNTER — Telehealth: Payer: Self-pay | Admitting: *Deleted

## 2022-10-24 NOTE — Telephone Encounter (Signed)
Pt calling about SLYND RX. Reports need for PA. Wants to get med today. Advised PA can be started but will take several days and may or may not be approved by insurance. Info given on [facility] manufacturer coupon. Pt does not want PA started today. Will have pharmacy run RX under new insurance next month. Pt will use manufacturer coupon and purchase out of pocket this month.

## 2022-11-10 NOTE — Progress Notes (Unsigned)
Office Visit Note  Patient: Maria Sutton             Date of Birth: 11/04/1995           MRN: [id number]             PCP: Tawnya Crook, MD Referring: Tawnya Crook, MD Visit Date: 11/11/2022   Subjective:  No chief complaint on file.   History of Present Illness: Maria Sutton is a 27 y.o. female here for follow up ***   Previous HPI 03/28/22 Maria Sutton is a 26 y.o. female here for follow up for seropositive RA on HCQ 200 mg daily.  She is overall doing fairly well most frequent problem have been exacerbations of right shoulder pain.  She notices this most often after a event where she can recall some overuse or catch full use applying a lot of pressure in the area.  She is getting more improvement with turmeric than NSAIDs as needed.  Also had a right ring finger distal fracture without complication.  She saw ophthalmology last year in May but not yet this year.     Previous HPI 09/10/21 Maria Sutton is a 27 y.o. female here for follow up for seropositive RA on hydroxychloroquine 200 mg daily with new development of increased joint pain at several sites.  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  She was having only partial improvement with taking ibuprofen for the increased joint pain she increased her hydroxychloroquine to 2 tablets daily as well.  For the past week she is avoided any heavy overhead lifting and right shoulder pain has improved but is also developed increased symptoms in right wrist and left hand PIP joints.  These have not been associated with any significant swelling or erythema.   Previous HPI 04/23/21 Maria Sutton is a 27 y.o. female here for follow up for seropositive RA on hydroxychloroquine 200 mg PO daily.  Since her last visit she has not noticed any major flareup of joint pain stiffness or swelling.   She not had any more problems since lowering the hydroxychloroquine to the 1 tablet daily dose.  She stopped taking the medicine for a few periods of time I think she noticed some increased joint pains particularly of the shoulders this improved again.  She was seen at the ED for viral upper respiratory infection a few times in April but no other significant infections.   10/25/2020 Maria Sutton is a 27 y.o. 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 She decreased her dose of medication from 400mg  to 200mg  due to intolerance and noticed problems such as sleep disturbance, jaw pain, eye twitching she was not sure which related to the medication or underlying issues. She feels that symptoms are partially improved with this, particularly TMJ pain also her right arm pain is improved since last visit and has not had a repeat episode of sharp chest pain. She does continue to have headaches and fatigue and right eyebrow twitching is worse.   DMARD Hx HCQ 08/2020-current   Labs 08/2020 ANA 1:320 speckled RF 42 CCP >250 SSA >8.0   08/2020 Vitamin D 26.7     No Rheumatology ROS completed.   PMFS History:  Patient Active Problem List   Diagnosis Date Noted  Pain in left shoulder 09/24/2022   High risk medication use 09/24/2022   Synovitis of right shoulder 06/24/2022   Plantar fascial fibromatosis of both feet 06/24/2022   Effusion of metatarsophalangeal (MTP) joint of great toe 06/24/2022   Pain in right shoulder 09/10/2021   Rheumatoid arthritis with rheumatoid factor of multiple sites without organ or systems involvement ([place]) 08/28/2020   Positive ANA (antinuclear antibody) 08/22/2020   Vitamin D deficiency 03/19/2019   Obesity with body mass index (BMI) of 30.0 to 39.9 03/19/2019   Reactive depression 09/25/2018   Migraine without aura and without status migrainosus, not intractable 09/25/2018    Cognitive complaints 09/25/2018    Past Medical History:  Diagnosis Date   ADHD    Anxiety    Depression    Depression    Phreesia 08/07/2020   Headache    Lupus (HCC)    PCOS (polycystic ovarian syndrome)    PTSD (post-traumatic stress disorder)    Rheumatoid arthritis ([place])    Systemic lupus erythematosus ([place])    Thyroid disease    hypothyroid    Family History  Problem Relation Age of Onset   Hypertension Mother    Breast cancer Other    No past surgical history on file. Social History   Social History Narrative   Lives with mom and dad   Right handed   Drinks 1 cup caffeine daily      Fraser Din great aunt an others w/breast ca    There is no immunization history on file for this patient.   Objective: Vital Signs: There were no vitals taken for this visit.   Physical Exam   Musculoskeletal Exam: ***  CDAI Exam: CDAI Score: -- Patient Global: --; Provider Global: -- Swollen: --; Tender: -- Joint Exam 11/11/2022   No joint exam has been documented for this visit   There is currently no information documented on the homunculus. Go to the Rheumatology activity and complete the homunculus joint exam.  Investigation: No additional findings.  Imaging: No results found.  Recent Labs: Lab Results  Component Value Date   WBC 6.2 09/16/2022   HGB 15.7 09/16/2022   PLT 284 09/16/2022   NA 139 03/28/2022   K 4.2 03/28/2022   CL 104 03/28/2022   CO2 23 03/28/2022   GLUCOSE 76 03/28/2022   BUN 7 03/28/2022   CREATININE 0.73 03/28/2022   BILITOT 0.5 06/25/2021   ALKPHOS 63 06/25/2021   AST 13 (L) 06/25/2021   ALT 13 06/25/2021   PROT 7.3 06/25/2021   ALBUMIN 4.1 06/25/2021   CALCIUM 9.5 03/28/2022   GFRAA 137 08/10/2020    Speciality Comments: PLQ Eye Exam- [place] 02/2021  Procedures:  No procedures performed Allergies: Other   Assessment / Plan:     Visit Diagnoses: No diagnosis found.  ***  Orders: No orders of the defined types  were placed in this encounter.  No orders of the defined types were placed in this encounter.    Follow-Up Instructions: No follow-ups on file.   Collier Salina, MD  Note - This record has been created using [employer].  Chart creation errors have been sought, but may not always  have been located. Such creation errors do not reflect on  the standard of medical care.

## 2022-11-11 ENCOUNTER — Encounter: Payer: Self-pay | Admitting: Internal Medicine

## 2022-11-11 ENCOUNTER — Ambulatory Visit: Payer: 59 | Attending: Internal Medicine | Admitting: Internal Medicine

## 2022-11-11 VITALS — BP 123/83 | HR 85 | Resp 14 | Ht 67.0 in | Wt 222.0 lb

## 2022-11-11 DIAGNOSIS — M0579 Rheumatoid arthritis with rheumatoid factor of multiple sites without organ or systems involvement: Secondary | ICD-10-CM | POA: Diagnosis not present

## 2022-11-11 DIAGNOSIS — Z79899 Other long term (current) drug therapy: Secondary | ICD-10-CM

## 2022-11-11 MED ORDER — DICLOFENAC SODIUM 75 MG PO TBEC: 75.0000 mg | DELAYED_RELEASE_TABLET | Freq: Every day | ORAL | 1 refills | Status: DC | PRN

## 2022-11-11 MED ORDER — FOLIC ACID 1 MG PO TABS: 1.0000 mg | ORAL_TABLET | Freq: Every day | ORAL | 3 refills | Status: DC

## 2022-11-11 MED ORDER — METHOTREXATE SODIUM 2.5 MG PO TABS: 15.0000 mg | ORAL_TABLET | ORAL | 0 refills | Status: DC

## 2022-11-12 ENCOUNTER — Telehealth: Payer: Self-pay | Admitting: Internal Medicine

## 2022-11-12 LAB — CBC WITH DIFFERENTIAL/PLATELET
Absolute Monocytes: 403 cells/uL (ref 200–950)
Basophils Absolute: 22 cells/uL (ref 0–200)
Basophils Relative: 0.3 %
Eosinophils Absolute: 79 cells/uL (ref 15–500)
Eosinophils Relative: 1.1 %
HCT: 45.5 % — ABNORMAL HIGH (ref 35.0–45.0)
Hemoglobin: 15.6 g/dL — ABNORMAL HIGH (ref 11.7–15.5)
Lymphs Abs: 2311 cells/uL (ref 850–3900)
MCH: 28.2 pg (ref 27.0–33.0)
MCHC: 34.3 g/dL (ref 32.0–36.0)
MCV: 82.1 fL (ref 80.0–100.0)
MPV: 10.4 fL (ref 7.5–12.5)
Monocytes Relative: 5.6 %
Neutro Abs: 4385 cells/uL (ref 1500–7800)
Neutrophils Relative %: 60.9 %
Platelets: 351 10*3/uL (ref 140–400)
RBC: 5.54 10*6/uL — ABNORMAL HIGH (ref 3.80–5.10)
RDW: 12.9 % (ref 11.0–15.0)
Total Lymphocyte: 32.1 %
WBC: 7.2 10*3/uL (ref 3.8–10.8)

## 2022-11-12 LAB — COMPLETE METABOLIC PANEL WITH GFR
AG Ratio: 1.6 (calc) (ref 1.0–2.5)
ALT: 17 U/L (ref 6–29)
AST: 14 U/L (ref 10–30)
Albumin: 4.7 g/dL (ref 3.6–5.1)
Alkaline phosphatase (APISO): 66 U/L (ref 31–125)
BUN: 10 mg/dL (ref 7–25)
CO2: 26 mmol/L (ref 20–32)
Calcium: 10 mg/dL (ref 8.6–10.2)
Chloride: 105 mmol/L (ref 98–110)
Creat: 0.78 mg/dL (ref 0.50–0.96)
Globulin: 3 g/dL (calc) (ref 1.9–3.7)
Glucose, Bld: 88 mg/dL (ref 65–99)
Potassium: 4.4 mmol/L (ref 3.5–5.3)
Sodium: 140 mmol/L (ref 135–146)
Total Bilirubin: 0.8 mg/dL (ref 0.2–1.2)
Total Protein: 7.7 g/dL (ref 6.1–8.1)
eGFR: 108 mL/min/{1.73_m2} (ref >=60)

## 2022-11-12 LAB — SEDIMENTATION RATE: Sed Rate: 11 mm/h (ref 0–20)

## 2022-11-12 NOTE — Telephone Encounter (Signed)
Patient called stating she was returning Marissa's call regarding her labwork results.   

## 2022-11-12 NOTE — Telephone Encounter (Signed)
Returned patient's call and advised her of lab results from 11/11/2022. Patient verbalized understanding.

## 2022-11-12 NOTE — Progress Notes (Signed)
La results look fine for continue the methotrexate and as needed diclofenac.

## 2023-01-13 ENCOUNTER — Ambulatory Visit (HOSPITAL_COMMUNITY): Payer: Self-pay

## 2023-01-16 ENCOUNTER — Other Ambulatory Visit: Payer: Self-pay | Admitting: Internal Medicine

## 2023-01-16 DIAGNOSIS — M0579 Rheumatoid arthritis with rheumatoid factor of multiple sites without organ or systems involvement: Secondary | ICD-10-CM

## 2023-01-16 NOTE — Telephone Encounter (Signed)
Last Fill: 11/11/2022  Labs: 11/11/2022 Lab results look fine for continue the methotrexate and as needed diclofenac.   Next Visit: 02/10/2023  Last Visit: 11/11/2022  DX: Rheumatoid arthritis with rheumatoid factor of multiple sites without organ or systems involvement   Current Dose per office note 11/11/2022: as needed diclofenac 75 mg p.o   Okay to refill Diclofenac?

## 2023-01-17 ENCOUNTER — Telehealth: Payer: Self-pay | Admitting: Rheumatology

## 2023-01-17 NOTE — Telephone Encounter (Signed)
I received a phone call from the patient at 7:22 PM.  The patient stated that she has been waiting to hear back from the office regarding methotrexate refill.  She left a message yesterday.  She states the brand of methotrexate is not available at [facility] pharmacy and the pharmacy needs approval for  another brand.  I called  [facility] pharmacy [phone].  The pharmacy was closed I told patient that I will try to call pharmacy tomorrow morning at 9:00 AM.  She voiced understanding.  Confirmed that she is taking methotrexate 2.5 mg tablet, 6 tablets p.o. weekly.  I reviewed the chart and her labs are up-to-date and within normal limits. [name], MD

## 2023-01-19 NOTE — Telephone Encounter (Signed)
I called the pharmacy on January 18, 2023 at 9:30 AM and approved the prescription for methotrexate.

## 2023-01-20 ENCOUNTER — Encounter: Payer: Self-pay | Admitting: *Deleted

## 2023-02-09 NOTE — Progress Notes (Unsigned)
Office Visit Note  Patient: Maria Sutton             Date of Birth: 1996-02-26           MRN: [id number]             PCP: Jeani Sow, MD Referring: Jeani Sow, MD Visit Date: 02/10/2023   Subjective:  No chief complaint on file.   History of Present Illness: Maria Sutton is a 27 y.o. female here for follow up ***   Previous HPI 11/11/22 Maria Sutton is a 27 y.o. female here for follow up for seropositive RA now on methotrexate 15 mg p.o. weekly and folic acid 1 mg daily and as needed diclofenac 75 mg p.o.  We started medication after telemedicine visit due to increased joint pain symptoms and new identified erosion at the left first MTP joint.  She is tolerating the methotrexate without any noticeable side effect.  She did have 1 probable cold or other upper respiratory infection that took a week to recover which is longer than usual otherwise no interval infections.  Symptoms are pretty well-controlled she is taking the diclofenac once every 2 to 3 days at this point.    Previous HPI 03/28/22 Maria Sutton is a 27 y.o. female here for follow up for seropositive RA on HCQ 200 mg daily.  She is overall doing fairly well most frequent problem have been exacerbations of right shoulder pain.  She notices this most often after a event where she can recall some overuse or catch full use applying a lot of pressure in the area.  She is getting more improvement with turmeric than NSAIDs as needed.  Also had a right ring finger distal fracture without complication.  She saw ophthalmology last year in May but not yet this year.     Previous HPI 09/10/21 Maria Sutton is a 27 y.o. female here for follow up for seropositive RA on hydroxychloroquine 200 mg daily with new development of increased joint pain at several sites.  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  She was having only partial improvement with taking ibuprofen for the increased joint pain she increased her hydroxychloroquine to 2 tablets daily as well.  For the past week she is avoided any heavy overhead lifting and right shoulder pain has improved but is also developed increased symptoms in right wrist and left hand PIP joints.  These have not been associated with any significant swelling or erythema.   Previous HPI 04/23/21 Maria Sutton is a 27 y.o. female here for follow up for seropositive RA on hydroxychloroquine 200 mg PO daily.  Since her last visit she has not noticed any major flareup of joint pain stiffness or swelling.  She not had any more problems since lowering the hydroxychloroquine to the 1 tablet daily dose.  She stopped taking the medicine for a few periods of time I think she noticed some increased joint pains particularly of the shoulders this improved again.  She was seen at the ED for viral upper respiratory infection a few times in April but no other significant infections.   10/25/2020 Maria Sutton is a 27 y.o. 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 She decreased her dose of medication from 400mg  to 200mg   due to intolerance and noticed problems such as sleep disturbance, jaw pain, eye twitching she was not sure which related to the medication or underlying issues. She feels that symptoms are partially improved with this, particularly TMJ pain also her right arm pain is improved since last visit and has not had a repeat episode of sharp chest pain. She does continue to have headaches and fatigue and right eyebrow twitching is worse.   DMARD Hx HCQ 08/2020-09/2022 Lack of efficacy MTX 09/2022-current   Labs 08/2020 ANA 1:320 speckled RF 42 CCP >250 SSA >8.0   08/2020 Vitamin D 26.7   No Rheumatology ROS completed.   PMFS History:  Patient Active Problem  List   Diagnosis Date Noted   Pain in left shoulder 09/24/2022   High risk medication use 09/24/2022   Synovitis of right shoulder 06/24/2022   Plantar fascial fibromatosis of both feet 06/24/2022   Effusion of metatarsophalangeal (MTP) joint of great toe 06/24/2022   Pain in right shoulder 09/10/2021   Rheumatoid arthritis with rheumatoid factor of multiple sites without organ or systems involvement (HCC) 08/28/2020   Positive ANA (antinuclear antibody) 08/22/2020   Vitamin D deficiency 03/19/2019   Obesity with body mass index (BMI) of 30.0 to 39.9 03/19/2019   Reactive depression 09/25/2018   Migraine without aura and without status migrainosus, not intractable 09/25/2018   Cognitive complaints 09/25/2018    Past Medical History:  Diagnosis Date   ADHD    Anxiety    Depression    Depression    Phreesia 08/07/2020   Headache    Lupus (HCC)    PCOS (polycystic ovarian syndrome)    PTSD (post-traumatic stress disorder)    Rheumatoid arthritis (HCC)    Systemic lupus erythematosus (HCC)    Thyroid disease    hypothyroid    Family History  Problem Relation Age of Onset   Hypertension Mother    Breast cancer Other    No past surgical history on file. Social History   Social History Narrative   Lives with mom and dad   Right handed   Drinks 1 cup caffeine daily      Dennie Bible great aunt an others w/breast ca    There is no immunization history on file for this patient.   Objective: Vital Signs: There were no vitals taken for this visit.   Physical Exam   Musculoskeletal Exam: ***  CDAI Exam: CDAI Score: -- Patient Global: --; Provider Global: -- Swollen: --; Tender: -- Joint Exam 02/10/2023   No joint exam has been documented for this visit   There is currently no information documented on the homunculus. Go to the Rheumatology activity and complete the homunculus joint exam.  Investigation: No additional findings.  Imaging: No results found.  Recent  Labs: Lab Results  Component Value Date   WBC 7.2 11/11/2022   HGB 15.6 (H) 11/11/2022   PLT 351 11/11/2022   NA 140 11/11/2022   K 4.4 11/11/2022   CL 105 11/11/2022   CO2 26 11/11/2022   GLUCOSE 88 11/11/2022   BUN 10 11/11/2022   CREATININE 0.78 11/11/2022   BILITOT 0.8 11/11/2022   ALKPHOS 63 06/25/2021   AST 14 11/11/2022   ALT 17 11/11/2022   PROT 7.7 11/11/2022   ALBUMIN 4.1 06/25/2021   CALCIUM 10.0 11/11/2022   GFRAA 137 08/10/2020    Speciality Comments: PLQ Eye Exam- [facility] 02/2021  Procedures:  No procedures performed Allergies: Other   Assessment / Plan:  Visit Diagnoses: No diagnosis found.  ***  Orders: No orders of the defined types were placed in this encounter.  No orders of the defined types were placed in this encounter.    Follow-Up Instructions: No follow-ups on file.   Collier Salina, MD  Note - This record has been created using [employer].  Chart creation errors have been sought, but may not always  have been located. Such creation errors do not reflect on  the standard of medical care.

## 2023-02-10 ENCOUNTER — Encounter: Payer: Self-pay | Admitting: Internal Medicine

## 2023-02-10 ENCOUNTER — Ambulatory Visit: Payer: 59 | Attending: Internal Medicine | Admitting: Internal Medicine

## 2023-02-10 VITALS — BP 119/79 | HR 85 | Resp 14 | Ht 67.0 in | Wt 221.0 lb

## 2023-02-10 DIAGNOSIS — Z79899 Other long term (current) drug therapy: Secondary | ICD-10-CM

## 2023-02-10 DIAGNOSIS — M0579 Rheumatoid arthritis with rheumatoid factor of multiple sites without organ or systems involvement: Secondary | ICD-10-CM

## 2023-02-10 DIAGNOSIS — M659 Synovitis and tenosynovitis, unspecified: Secondary | ICD-10-CM | POA: Diagnosis not present

## 2023-02-10 MED ORDER — DICLOFENAC SODIUM 75 MG PO TBEC: 75.0000 mg | DELAYED_RELEASE_TABLET | Freq: Two times a day (BID) | ORAL | 2 refills | Status: DC | PRN

## 2023-02-10 MED ORDER — METHOTREXATE SODIUM 2.5 MG PO TABS: 15.0000 mg | ORAL_TABLET | ORAL | 0 refills | Status: DC

## 2023-02-11 LAB — SEDIMENTATION RATE: Sed Rate: 11 mm/h (ref 0–20)

## 2023-02-11 LAB — COMPLETE METABOLIC PANEL WITH GFR
AG Ratio: 1.6 (calc) (ref 1.0–2.5)
ALT: 12 U/L (ref 6–29)
AST: 14 U/L (ref 10–30)
Albumin: 4.6 g/dL (ref 3.6–5.1)
Alkaline phosphatase (APISO): 63 U/L (ref 31–125)
BUN: 8 mg/dL (ref 7–25)
CO2: 25 mmol/L (ref 20–32)
Calcium: 9.6 mg/dL (ref 8.6–10.2)
Chloride: 106 mmol/L (ref 98–110)
Creat: 0.68 mg/dL (ref 0.50–0.96)
Globulin: 2.8 g/dL (calc) (ref 1.9–3.7)
Glucose, Bld: 85 mg/dL (ref 65–99)
Potassium: 4.3 mmol/L (ref 3.5–5.3)
Sodium: 140 mmol/L (ref 135–146)
Total Bilirubin: 0.9 mg/dL (ref 0.2–1.2)
Total Protein: 7.4 g/dL (ref 6.1–8.1)
eGFR: 124 mL/min/{1.73_m2} (ref >=60)

## 2023-02-11 LAB — CBC WITH DIFFERENTIAL/PLATELET
Absolute Monocytes: 284 cells/uL (ref 200–950)
Basophils Absolute: 19 cells/uL (ref 0–200)
Basophils Relative: 0.3 %
Eosinophils Absolute: 113 cells/uL (ref 15–500)
Eosinophils Relative: 1.8 %
HCT: 45.3 % — ABNORMAL HIGH (ref 35.0–45.0)
Hemoglobin: 15.5 g/dL (ref 11.7–15.5)
Lymphs Abs: 1714 cells/uL (ref 850–3900)
MCH: 28.9 pg (ref 27.0–33.0)
MCHC: 34.2 g/dL (ref 32.0–36.0)
MCV: 84.5 fL (ref 80.0–100.0)
MPV: 10.7 fL (ref 7.5–12.5)
Monocytes Relative: 4.5 %
Neutro Abs: 4171 cells/uL (ref 1500–7800)
Neutrophils Relative %: 66.2 %
Platelets: 313 10*3/uL (ref 140–400)
RBC: 5.36 10*6/uL — ABNORMAL HIGH (ref 3.80–5.10)
RDW: 12.8 % (ref 11.0–15.0)
Total Lymphocyte: 27.2 %
WBC: 6.3 10*3/uL (ref 3.8–10.8)

## 2023-02-13 ENCOUNTER — Ambulatory Visit
Admission: EM | Admit: 2023-02-13 | Discharge: 2023-02-13 | Disposition: A | Discharge status: Home / Self Care | Payer: 59

## 2023-02-13 DIAGNOSIS — J309 Allergic rhinitis, unspecified: Secondary | ICD-10-CM | POA: Diagnosis not present

## 2023-02-13 MED ORDER — FLUTICASONE PROPIONATE 50 MCG/ACT NA SUSP
1.0000 | Freq: Every day | NASAL | 0 refills | Status: DC
Start: 2023-02-13 — End: 2023-09-11

## 2023-02-13 MED ORDER — MONTELUKAST SODIUM 10 MG PO TABS
10.0000 mg | ORAL_TABLET | Freq: Every day | ORAL | 0 refills | Status: DC
Start: 2023-02-13 — End: 2023-09-11

## 2023-02-13 NOTE — Discharge Instructions (Signed)
Start Singulair nightly for the next 2 weeks Flonase daily You may continue Claritin-D during the day If your symptoms do not improve please either follow-up with your primary or with an allergist Please go to the ER if you develop any worsening symptoms

## 2023-02-13 NOTE — ED Triage Notes (Signed)
Pt c/o left side of nose/face itching x1 month. Pt states sneezing worsens the itch. She has manually itched her left tonsil. Benadryl claritin D, dayquil, nyquil don't help. Cough started today.  HX Autoimmune disease

## 2023-02-13 NOTE — ED Provider Notes (Addendum)
[facility]    CSN: [id number] Arrival date & time: 02/13/23  1202      History   Chief Complaint Chief Complaint  Patient presents with   Pruritis    throat    HPI Maria Sutton is a 27 y.o. female presents for allergy type symptoms.  Patient reports over the past month she has had waxing and waning itching of the back of her left throat, sneezing and itching from her left nostril.  She denies any sore throat, cough or congestion.  Occasional rhinorrhea.  Does have a history of allergies and has been taking Benadryl, Claritin-D, DayQuil and NyQuil for 2 weeks and states that it temporarily improved symptoms but does not make them resolved.  She does have a history of rheumatoid arthritis.  No other concerns at this time.  HPI  Past Medical History:  Diagnosis Date   ADHD    Anxiety    Depression    Depression    Phreesia 08/07/2020   Headache    Lupus (HCC)    PCOS (polycystic ovarian syndrome)    PTSD (post-traumatic stress disorder)    Rheumatoid arthritis (HCC)    Systemic lupus erythematosus (HCC)    Thyroid disease    hypothyroid    Patient Active Problem List   Diagnosis Date Noted   Pain in left shoulder 09/24/2022   High risk medication use 09/24/2022   Synovitis of right shoulder 06/24/2022   Plantar fascial fibromatosis of both feet 06/24/2022   Effusion of metatarsophalangeal (MTP) joint of great toe 06/24/2022   Pain in right shoulder 09/10/2021   Rheumatoid arthritis with rheumatoid factor of multiple sites without organ or systems involvement (HCC) 08/28/2020   Positive ANA (antinuclear antibody) 08/22/2020   Vitamin D deficiency 03/19/2019   Obesity with body mass index (BMI) of 30.0 to 39.9 03/19/2019   Reactive depression 09/25/2018   Migraine without aura and without status migrainosus, not intractable 09/25/2018   Cognitive complaints 09/25/2018    History reviewed. No pertinent surgical history.  OB History     Gravida  0    Para  0   Term  0   Preterm  0   AB  0   Living  0      SAB  0   IAB  0   Ectopic  0   Multiple  0   Live Births  0            Home Medications    Prior to Admission medications   Medication Sig Start Date End Date Taking? Authorizing Provider  diclofenac (CATAFLAM) 50 MG tablet Take 50 mg by mouth 3 (three) times daily.   Yes [provider]  fluticasone (FLONASE) 50 MCG/ACT nasal spray Place 1 spray into both nostrils daily. 02/13/23  Yes Radford Pax, NP  montelukast (SINGULAIR) 10 MG tablet Take 1 tablet (10 mg total) by mouth at bedtime. 02/13/23  Yes Radford Pax, NP  norethindrone-ethinyl estradiol-iron (ESTROSTEP FE) 1-20/1-30/1-35 MG-MCG tablet Take 1 tablet by mouth daily.   Yes [provider]  diclofenac (VOLTAREN) 75 MG EC tablet Take 1 tablet (75 mg total) by mouth 2 (two) times daily as needed. 02/10/23   Rice, Jamesetta Orleans, MD  Drospirenone (SLYND) 4 MG TABS Take 1 tablet by mouth daily. 09/17/22   Corlis Hove, NP  folic acid (FOLVITE) 1 MG tablet Take 1 tablet (1 mg total) by mouth daily. 11/11/22   Fuller Plan, MD  methotrexate (RHEUMATREX) 2.5 MG tablet Take 6 tablets (15 mg total) by mouth once a week. Caution:Chemotherapy. Protect from light. 02/10/23   Rice, Jamesetta Orleans, MD    Family History Family History  Problem Relation Age of Onset   Hypertension Mother    Breast cancer Other     Social History Social History   Tobacco Use   Smoking status: Never    Passive exposure: Never   Smokeless tobacco: Never  Vaping Use   Vaping Use: Never used  Substance Use Topics   Alcohol use: Not Currently    Comment: rarely   Drug use: Never     Allergies   Other   Review of Systems Review of Systems  HENT:  Positive for sneezing.        Itchy throat     Physical Exam Triage Vital Signs ED Triage Vitals  Enc Vitals Group     BP 02/13/23 1232 120/85     Pulse Rate 02/13/23 1232 88     Resp 02/13/23 1232 16      Temp 02/13/23 1232 99.2 F (37.3 C)     Temp Source 02/13/23 1232 Oral     SpO2 02/13/23 1232 96 %     Weight --      Height --      Head Circumference --      Peak Flow --      Pain Score 02/13/23 1234 0     Pain Loc --      Pain Edu? --      Excl. in GC? --    No data found.  Updated Vital Signs BP 120/85 (BP Location: Right Arm)   Pulse 88   Temp 99.2 F (37.3 C) (Oral)   Resp 16   SpO2 96%   Visual Acuity Right Eye Distance:   Left Eye Distance:   Bilateral Distance:    Right Eye Near:   Left Eye Near:    Bilateral Near:     Physical Exam Vitals and nursing note reviewed.  Constitutional:      General: She is not in acute distress.    Appearance: She is well-developed. She is not ill-appearing.  HENT:     Head: Normocephalic and atraumatic.     Right Ear: Tympanic membrane and ear canal normal.     Left Ear: Tympanic membrane and ear canal normal.     Nose: Rhinorrhea present. No congestion.     Right Turbinates: Not pale.     Left Turbinates: Swollen. Not pale.     Right Sinus: No maxillary sinus tenderness or frontal sinus tenderness.     Left Sinus: No maxillary sinus tenderness or frontal sinus tenderness.     Mouth/Throat:     Mouth: Mucous membranes are moist.     Pharynx: Oropharynx is clear. Uvula midline. No posterior oropharyngeal erythema.     Tonsils: No tonsillar exudate or tonsillar abscesses.  Eyes:     Conjunctiva/sclera: Conjunctivae normal.     Pupils: Pupils are equal, round, and reactive to light.  Cardiovascular:     Rate and Rhythm: Normal rate and regular rhythm.     Heart sounds: Normal heart sounds.  Pulmonary:     Effort: Pulmonary effort is normal.     Breath sounds: Normal breath sounds.  Musculoskeletal:     Cervical back: Normal range of motion and neck supple.  Lymphadenopathy:     Cervical: No cervical adenopathy.  Skin:    General: Skin  is warm and dry.  Neurological:     General: No focal deficit present.      Mental Status: She is alert and oriented to person, place, and time.  Psychiatric:        Mood and Affect: Mood normal.        Behavior: Behavior normal.      UC Treatments / Results  Labs (all labs ordered are listed, but only abnormal results are displayed) Labs Reviewed - No data to display  CBC with Differential/Platelet Order: [id number] Status: Final result     Visible to patient: Yes (seen)     Next appt: 05/26/2023 at 08:00 AM in Rheumatology Fuller Plan, MD)     Dx: High risk medication use   0 Result Notes          Component Ref Range & Units 3 d ago (02/10/23) 3 mo ago (11/11/22) 5 mo ago (09/16/22) 10 mo ago (03/28/22) 1 yr ago (09/10/21) 1 yr ago (06/25/21) 2 yr ago (01/15/21)  WBC 3.8 - 10.8 Thousand/uL 6.3 7.2 6.2 R 6.9 8.8 8.3 R 9.8 R  RBC 3.80 - 5.10 Million/uL 5.36 High  5.54 High  5.63 High  R 5.63 High  5.40 High  5.18 High  R 5.63 High  R  Hemoglobin 11.7 - 15.5 g/dL 40.9 81.1 High  91.4 R 15.9 High  14.9 14.5 R 15.8 High  R  HCT 35.0 - 45.0 % 45.3 High  45.5 High  45.5 R 47.4 High  44.5 42.2 R 46.9 High  R  MCV 80.0 - 100.0 fL 84.5 82.1 81 R 84.2 82.4 81.5 83.3  MCH 27.0 - 33.0 pg 28.9 28.2 27.9 R 28.2 27.6 28.0 R 28.1 R  MCHC 32.0 - 36.0 g/dL 78.2 95.6 21.3 R 08.6 57.8 34.4 R 33.7 R  RDW 11.0 - 15.0 % 12.8 12.9 11.8 R 12.7 12.1 12.1 R 12.3 R  Platelets 140 - 400 Thousand/uL 313 351 284 R 277 290 274 R 303 R  MPV 7.5 - 12.5 fL 10.7 10.4  10.8 11.3    Neutro Abs 1,500 - 7,800 cells/uL 4,171 4,385  3,947 5,333  5.7 R  Lymphs Abs 850 - 3,900 cells/uL 1,714 2,311  2,519 2,737  3.4 R  Absolute Monocytes 200 - 950 cells/uL 284 403  331 590    Eosinophils Absolute 15 - 500 cells/uL 113 79  83 123  0.1 R  Basophils Absolute 0 - 200 cells/uL 19 22  21 18   0.0 R  Neutrophils Relative % % 66.2 60.9  57.2 60.6  58  Total Lymphocyte % 27.2 32.1  36.5 31.1    Monocytes Relative % 4.5 5.6  4.8 6.7  6  Eosinophils Relative % 1.8 1.1  1.2 1.4   1  Basophils Relative % 0.3 0.3  0.3 0.2  0  nRBC      0.0 R, CM 0.0 R  Lymphocytes Relative       35 R  Monocytes Absolute       0.6 R  Immature Granulocytes       0 R  Abs Immature Granulocytes       0.02 R, CM  Resulting Agency [facility] [place] [facility] [place] [facility] [place] [facility]              EKG   Radiology No results found.  Procedures Procedures (including critical care time)  Medications Ordered in  UC Medications - No data to display  Initial Impression / Assessment and Plan / UC Course  I have reviewed the triage vital signs and the nursing notes.  Pertinent labs & imaging results that were available during my care of the patient were reviewed by me and considered in my medical decision making (see chart for details).    Reviewed recent labs and progress notes Reviewed exam and symptoms.  Discussed likely allergic rhinitis as cause of symptoms Will start trial of Singulair nightly for 14 days.  Will also start Flonase daily She can continue Claritin-D during the day Advised to follow-up with her PCP or allergist if symptoms do not improve ER precautions reviewed and patient verbalized understanding Final Clinical Impressions(s) / UC Diagnoses   Final diagnoses:  Allergic rhinitis, unspecified seasonality, unspecified trigger     Discharge Instructions      Start Singulair nightly for the next 2 weeks Flonase daily You may continue Claritin-D during the day If your symptoms do not improve please either follow-up with your primary or with an allergist Please go to the ER if you develop any worsening symptoms    ED Prescriptions     Medication Sig Dispense Auth. Provider   fluticasone (FLONASE) 50 MCG/ACT nasal spray Place 1 spray into both nostrils daily. 15.8 mL Radford Pax, NP   montelukast (SINGULAIR) 10 MG tablet Take 1 tablet (10 mg total) by  mouth at bedtime. 14 tablet Radford Pax, NP      PDMP not reviewed this encounter.   Radford Pax, NP 02/13/23 1302    Radford Pax, NP 02/13/23 [phone]

## 2023-02-14 NOTE — Progress Notes (Signed)
Lab results look good normal sed rate, blood count, and metabolic panel. No change needed for current methotrexate.

## 2023-03-24 DIAGNOSIS — J029 Acute pharyngitis, unspecified: Secondary | ICD-10-CM | POA: Diagnosis not present

## 2023-03-24 DIAGNOSIS — Z20822 Contact with and (suspected) exposure to covid-19: Secondary | ICD-10-CM | POA: Diagnosis not present

## 2023-03-24 DIAGNOSIS — R051 Acute cough: Secondary | ICD-10-CM | POA: Diagnosis not present

## 2023-05-03 ENCOUNTER — Other Ambulatory Visit: Payer: Self-pay | Admitting: Internal Medicine

## 2023-05-03 DIAGNOSIS — M0579 Rheumatoid arthritis with rheumatoid factor of multiple sites without organ or systems involvement: Secondary | ICD-10-CM

## 2023-05-05 NOTE — Telephone Encounter (Signed)
Last Fill: 02/10/2023  Labs: 02/10/2023 Lab results look good normal sed rate, blood count, and metabolic panel. No change needed for current methotrexate.   Next Visit: 05/26/2023  Last Visit: 02/10/2023  DX: Rheumatoid arthritis with rheumatoid factor of multiple sites without organ or systems involvement   Current Dose per office note 02/10/2023: methotrexate 15 mg p.o. weekly   Okay to refill Methotrexate?

## 2023-05-12 NOTE — Progress Notes (Signed)
Office Visit Note  Patient: Maria Sutton             Date of Birth: 1996/06/20           MRN: [id number]             PCP: Jeani Sow, MD Referring: Jeani Sow, MD Visit Date: 05/26/2023   Subjective:  Follow-up   History of Present Illness: Maria Sutton is a 27 y.o. 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 weekly folic acid 1 mg daily and as needed diclofenac 75 mg.  She was sick with upper respiratory infection symptoms 1 time in late May after our last visit initially thought this was allergic rhinitis but then developed increased symptoms.  She was negative for viral or bacterial throat swab tests but ended up finishing a course of antibiotics and had cough that resolved about 3 weeks later.  Shoulder pain has been doing well with the diclofenac as needed.  Pain in her toe is also doing well and not having much swelling.  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  Has a lot of stress in her work situation.  Previous HPI 02/10/2023 Maria Sutton is a 27 y.o. 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 weekly folic acid 1 mg daily and as needed diclofenac 75 mg.  Overall joint inflammation is improved compared to before still requires as needed diclofenac for shoulder pain but only provoked after more significant overexertion.  She is tolerating the medication okay but thinks she is having some mild side effects.  Experiencing photosensitivity with patchy or bumpy erythematous rashes on sun exposed areas this does better when using sunblock.  She has never noticed this type of rash with sun exposure previously.  Also has been sick with 3 rounds of what sounds like viral URI symptoms since our last visit most recent 1 resolved 3 to 4 weeks ago.   Previous HPI 11/11/22 Maria Sutton is a 27 y.o. female here for follow up for seropositive RA now on methotrexate 15 mg  p.o. weekly and folic acid 1 mg daily and as needed diclofenac 75 mg p.o.  We started medication after telemedicine visit due to increased joint pain symptoms and new identified erosion at the left first MTP joint.  She is tolerating the methotrexate without any noticeable side effect.  She did have 1 probable cold or other upper respiratory infection that took a week to recover which is longer than usual otherwise no interval infections.  Symptoms are pretty well-controlled she is taking the diclofenac once every 2 to 3 days at this point.    Previous HPI 03/28/22 Maria Sutton is a 27 y.o. female here for follow up for seropositive RA on HCQ 200 mg daily.  She is overall doing fairly well most frequent problem have been exacerbations of right shoulder pain.  She notices this most often after a event where she can recall some overuse or catch full use applying a lot of pressure in the area.  She is getting more improvement with turmeric than NSAIDs as needed.  Also had a right ring finger distal fracture without complication.  She saw ophthalmology last year in May but not yet this year.     Previous HPI 09/10/21 Maria Sutton is a 27 y.o. female here for follow up for seropositive RA on hydroxychloroquine 200 mg daily  with new development of increased joint pain at several sites.  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  She was having only partial improvement with taking ibuprofen for the increased joint pain she increased her hydroxychloroquine to 2 tablets daily as well.  For the past week she is avoided any heavy overhead lifting and right shoulder pain has improved but is also developed increased symptoms in right wrist and left hand PIP joints.  These have not been associated with any significant swelling or erythema.   Previous HPI 04/23/21 Maria Sutton is a 27 y.o. female here for follow up for seropositive RA on hydroxychloroquine 200 mg PO daily.  Since her last visit she has not noticed any major flareup of joint pain stiffness or swelling.  She not had any more problems since lowering the hydroxychloroquine to the 1 tablet daily dose.  She stopped taking the medicine for a few periods of time I think she noticed some increased joint pains particularly of the shoulders this improved again.  She was seen at the ED for viral upper respiratory infection a few times in April but no other significant infections.   10/25/2020 Maria Sutton is a 27 y.o. 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 She decreased her dose of medication from 400mg  to 200mg  due to intolerance and noticed problems such as sleep disturbance, jaw pain, eye twitching she was not sure which related to the medication or underlying issues. She feels that symptoms are partially improved with this, particularly TMJ pain also her right arm pain is improved since last visit and has not had a repeat episode of sharp chest pain. She does continue to have headaches and fatigue and right eyebrow twitching is worse.   DMARD Hx HCQ 08/2020-09/2022 Lack of efficacy MTX 09/2022-current   Labs 08/2020 ANA 1:320 speckled RF 42 CCP >250 SSA >8.0   08/2020 Vitamin D 26.7   Review of Systems  Constitutional:  Positive for fatigue.  HENT:  Negative for mouth sores and mouth dryness.   Eyes:  Negative for dryness.  Respiratory:  Positive for shortness of breath.   Cardiovascular:  Negative for chest pain and palpitations.  Gastrointestinal:  Negative for blood in stool, constipation and diarrhea.  Endocrine: Negative for increased urination.  Genitourinary:  Negative for involuntary urination.  Musculoskeletal:  Positive for muscle weakness. Negative for joint pain, gait problem, joint pain, joint  swelling, myalgias, morning stiffness, muscle tenderness and myalgias.  Skin:  Negative for color change, rash, hair loss and sensitivity to sunlight.  Allergic/Immunologic: Negative for susceptible to infections.  Neurological:  Positive for headaches. Negative for dizziness.  Hematological:  Negative for swollen glands.  Psychiatric/Behavioral:  Positive for sleep disturbance. Negative for depressed mood. The patient is nervous/anxious.     PMFS History:  Patient Active Problem List   Diagnosis Date Noted   Pain in left shoulder 09/24/2022   High risk medication use 09/24/2022   Synovitis of right shoulder 06/24/2022   Plantar fascial fibromatosis of both feet 06/24/2022   Effusion of metatarsophalangeal (MTP) joint of great toe 06/24/2022   Pain in right shoulder 09/10/2021   Rheumatoid arthritis with rheumatoid factor of multiple sites without organ or systems involvement (HCC) 08/28/2020   Positive ANA (antinuclear antibody) 08/22/2020  Vitamin D deficiency 03/19/2019   Obesity with body mass index (BMI) of 30.0 to 39.9 03/19/2019   Reactive depression 09/25/2018   Migraine without aura and without status migrainosus, not intractable 09/25/2018   Cognitive complaints 09/25/2018    Past Medical History:  Diagnosis Date   ADHD    Anxiety    Depression    Depression    Phreesia 08/07/2020   Headache    Lupus (HCC)    PCOS (polycystic ovarian syndrome)    PTSD (post-traumatic stress disorder)    Rheumatoid arthritis (HCC)    Systemic lupus erythematosus (HCC)    Thyroid disease    hypothyroid    Family History  Problem Relation Age of Onset   Hypertension Mother    Breast cancer Other    History reviewed. No pertinent surgical history. Social History   Social History Narrative   Lives with mom and dad   Right handed   Drinks 1 cup caffeine daily      Dennie Bible great aunt an others w/breast ca    There is no immunization history on file for this patient.    Objective: Vital Signs: BP 120/87 (BP Location: Left Arm, Patient Position: Sitting, Cuff Size: Normal)   Pulse 79   Resp 14   Ht 5\' 7"  (1.702 m)   Wt 222 lb (100.7 kg)   BMI 34.77 kg/m    Physical Exam Constitutional:      Appearance: She is obese.  Eyes:     Conjunctiva/sclera: Conjunctivae normal.  Cardiovascular:     Rate and Rhythm: Normal rate and regular rhythm.  Pulmonary:     Effort: Pulmonary effort is normal.     Breath sounds: Normal breath sounds.  Musculoskeletal:     Right lower leg: No edema.     Left lower leg: No edema.  Lymphadenopathy:     Cervical: No cervical adenopathy.  Skin:    General: Skin is warm and dry.     Findings: No rash.  Neurological:     Mental Status: She is alert.  Psychiatric:        Mood and Affect: Mood normal.      Musculoskeletal Exam:  Shoulders full ROM no tenderness or swelling Elbows full ROM no tenderness or swelling Wrists full ROM no tenderness or swelling Fingers full ROM no tenderness or swelling Knees full ROM no tenderness or swelling Ankles full ROM no tenderness or swelling MTPs full ROM no tenderness or swelling   CDAI Exam: CDAI Score: 2  Patient Global: 10 / 100; Provider Global: 10 / 100 Swollen: 0 ; Tender: 0  Joint Exam 05/26/2023   All documented joints were normal     Investigation: No additional findings.  Imaging: No results found.  Recent Labs: Lab Results  Component Value Date   WBC 6.3 02/10/2023   HGB 15.5 02/10/2023   PLT 313 02/10/2023   NA 140 02/10/2023   K 4.3 02/10/2023   CL 106 02/10/2023   CO2 25 02/10/2023   GLUCOSE 85 02/10/2023   BUN 8 02/10/2023   CREATININE 0.68 02/10/2023   BILITOT 0.9 02/10/2023   ALKPHOS 63 06/25/2021   AST 14 02/10/2023   ALT 12 02/10/2023   PROT 7.4 02/10/2023   ALBUMIN 4.1 06/25/2021   CALCIUM 9.6 02/10/2023   GFRAA 137 08/10/2020    Speciality Comments: PLQ Eye Exam- [facility] 02/2021  Procedures:  No procedures  performed Allergies: Patient has no active allergies.   Assessment /  Plan:     Visit Diagnoses: Rheumatoid arthritis with rheumatoid factor of multiple sites without organ or systems involvement (HCC) - Plan: Sedimentation rate  Joint inflammation appears well-controlled no synovitis present on exam and less tender joints compared to last visit.  Recheck sedimentation rate for disease activity monitoring.  Plan to continue methotrexate 15 mg p.o. weekly folic acid 1 mg daily and diclofenac 75 mg as needed.  High risk medication use - methotrexate 15 mg p.o. weekly and folic acid 1 mg daily. diclofenac 75 mg - Plan: CBC with Differential/Platelet, COMPLETE METABOLIC PANEL WITH GFR  Checking CBC and CMP for medication monitoring on continued long-term use of methotrexate.  One upper respiratory infection since her last visit it took a while to clear up but no other complication.  Synovitis of right shoulder  Symptoms currently improved no swelling or restricted range of motion today.  More symptomatic with increased use and work schedule.  And diclofenac 75 mg as needed does not use every day but is pretty beneficial.  Orders: Orders Placed This Encounter  Procedures   Sedimentation rate   CBC with Differential/Platelet   COMPLETE METABOLIC PANEL WITH GFR   Meds ordered this encounter  Medications   methotrexate (RHEUMATREX) 2.5 MG tablet    Sig: Take 6 tablets (15 mg total) by mouth once a week.    Dispense:  78 tablet    Refill:  0     Follow-Up Instructions: No follow-ups on file.   Fuller Plan, MD  Note - This record has been created using [employer].  Chart creation errors have been sought, but may not always  have been located. Such creation errors do not reflect on  the standard of medical care.

## 2023-05-26 ENCOUNTER — Encounter: Payer: Self-pay | Admitting: Internal Medicine

## 2023-05-26 ENCOUNTER — Ambulatory Visit: Payer: 59 | Attending: Internal Medicine | Admitting: Internal Medicine

## 2023-05-26 VITALS — BP 120/87 | HR 79 | Resp 14 | Ht 67.0 in | Wt 222.0 lb

## 2023-05-26 DIAGNOSIS — Z79899 Other long term (current) drug therapy: Secondary | ICD-10-CM | POA: Diagnosis not present

## 2023-05-26 DIAGNOSIS — M659 Synovitis and tenosynovitis, unspecified: Secondary | ICD-10-CM | POA: Diagnosis not present

## 2023-05-26 DIAGNOSIS — M0579 Rheumatoid arthritis with rheumatoid factor of multiple sites without organ or systems involvement: Secondary | ICD-10-CM

## 2023-05-26 MED ORDER — METHOTREXATE SODIUM 2.5 MG PO TABS: 15.0000 mg | ORAL_TABLET | ORAL | 0 refills | Status: DC

## 2023-05-27 LAB — CBC WITH DIFFERENTIAL/PLATELET
Absolute Monocytes: 367 {cells}/uL (ref 200–950)
Basophils Absolute: 22 {cells}/uL (ref 0–200)
Basophils Relative: 0.3 %
Eosinophils Absolute: 108 cells/uL (ref 15–500)
Eosinophils Relative: 1.5 %
HCT: 46.3 % — ABNORMAL HIGH (ref 35.0–45.0)
Hemoglobin: 15.6 g/dL — ABNORMAL HIGH (ref 11.7–15.5)
Lymphs Abs: 1937 cells/uL (ref 850–3900)
MCH: 28.9 pg (ref 27.0–33.0)
MCHC: 33.7 g/dL (ref 32.0–36.0)
MCV: 85.7 fL (ref 80.0–100.0)
MPV: 10.1 fL (ref 7.5–12.5)
Monocytes Relative: 5.1 %
Neutro Abs: 4766 {cells}/uL (ref 1500–7800)
Neutrophils Relative %: 66.2 %
Platelets: 339 10*3/uL (ref 140–400)
RBC: 5.4 10*6/uL — ABNORMAL HIGH (ref 3.80–5.10)
RDW: 12.9 % (ref 11.0–15.0)
Total Lymphocyte: 26.9 %
WBC: 7.2 10*3/uL (ref 3.8–10.8)

## 2023-05-27 LAB — COMPLETE METABOLIC PANEL WITH GFR
AG Ratio: 1.7 (calc) (ref 1.0–2.5)
ALT: 12 U/L (ref 6–29)
AST: 13 U/L (ref 10–30)
Albumin: 4.6 g/dL (ref 3.6–5.1)
Alkaline phosphatase (APISO): 71 U/L (ref 31–125)
BUN: 7 mg/dL (ref 7–25)
CO2: 26 mmol/L (ref 20–32)
Calcium: 9.8 mg/dL (ref 8.6–10.2)
Chloride: 104 mmol/L (ref 98–110)
Creat: 0.71 mg/dL (ref 0.50–0.96)
Globulin: 2.7 g/dL (ref 1.9–3.7)
Glucose, Bld: 93 mg/dL (ref 65–99)
Potassium: 4.4 mmol/L (ref 3.5–5.3)
Sodium: 138 mmol/L (ref 135–146)
Total Bilirubin: 0.9 mg/dL (ref 0.2–1.2)
Total Protein: 7.3 g/dL (ref 6.1–8.1)
eGFR: 121 mL/min/{1.73_m2} (ref >=60)

## 2023-05-27 LAB — SEDIMENTATION RATE: Sed Rate: 11 mm/h (ref 0–20)

## 2023-06-13 ENCOUNTER — Telehealth: Payer: Self-pay

## 2023-06-13 NOTE — Telephone Encounter (Signed)
Patient called stating her pharmacy needed Dr. Gregary Cromer approval to change manufacturer for methotrexate rx. Dr. Dimple Casey gave verbal authorization approval to change manufacturer. I called pharmacy and advised.

## 2023-07-22 IMAGING — DX DG FINGER RING 2+V*R*
4 series · 4 of 4 positions shown · non-contrast
Comparison: None.

CLINICAL DATA: Right fourth finger injury while walking dog last
night

EXAM:
RIGHT RING FINGER 2+V

[finger ap (1 of 2)]
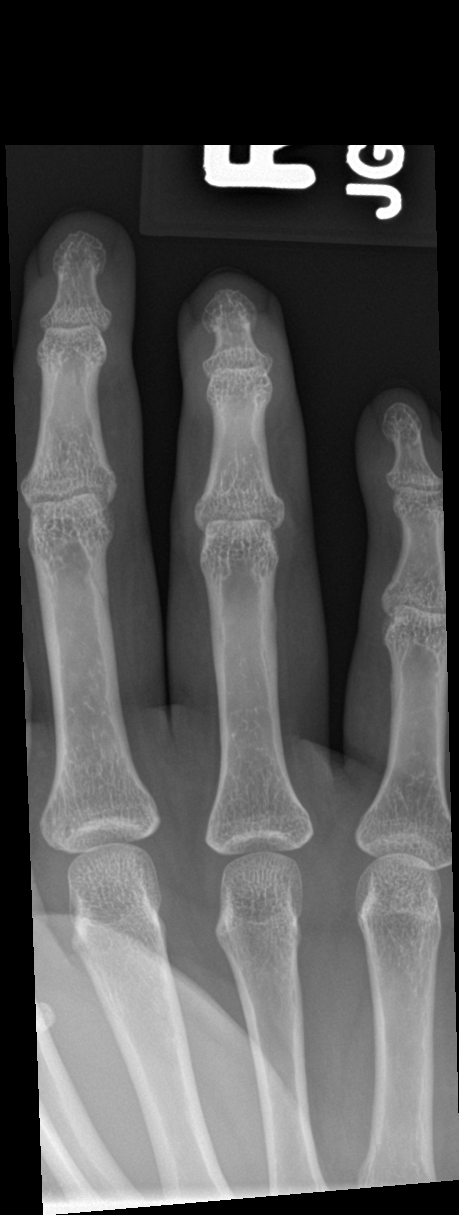

[finger obl]
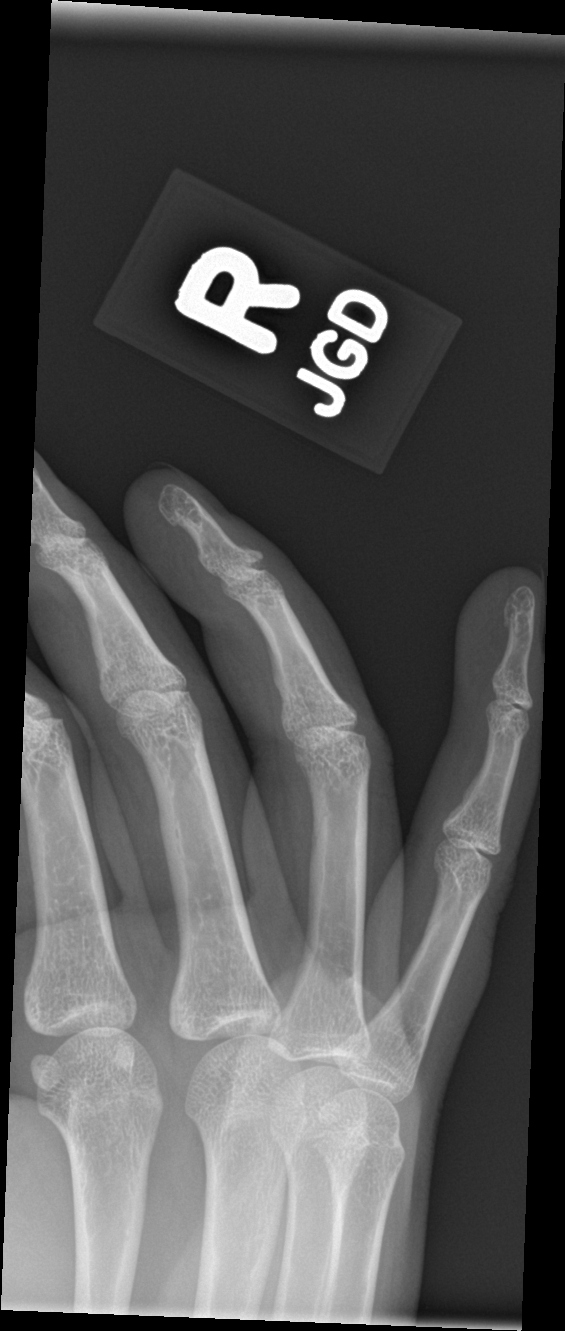

[finger lat]
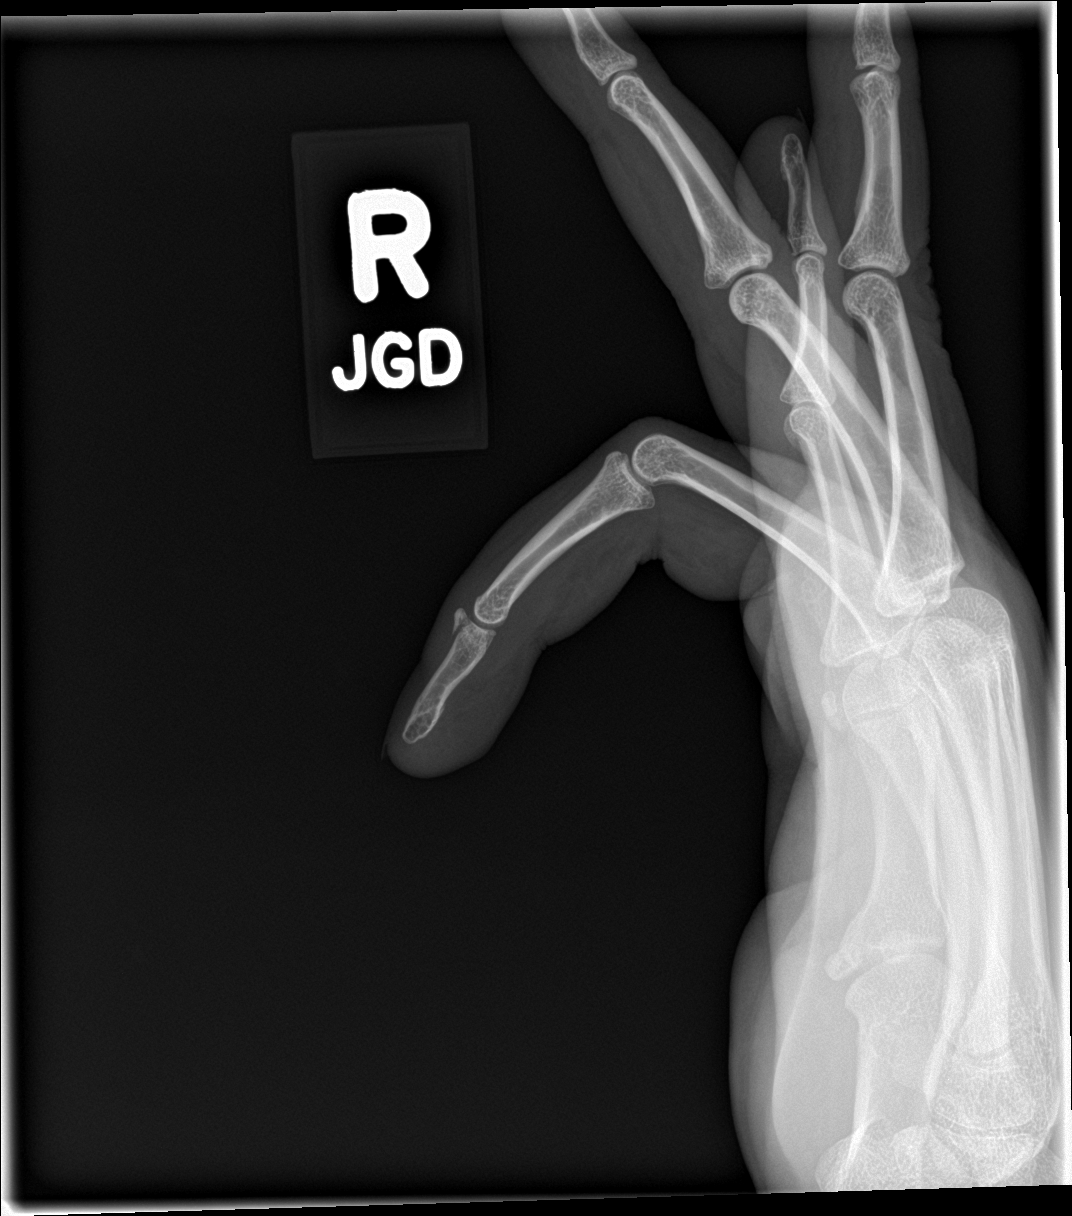

[finger ap (2 of 2)]
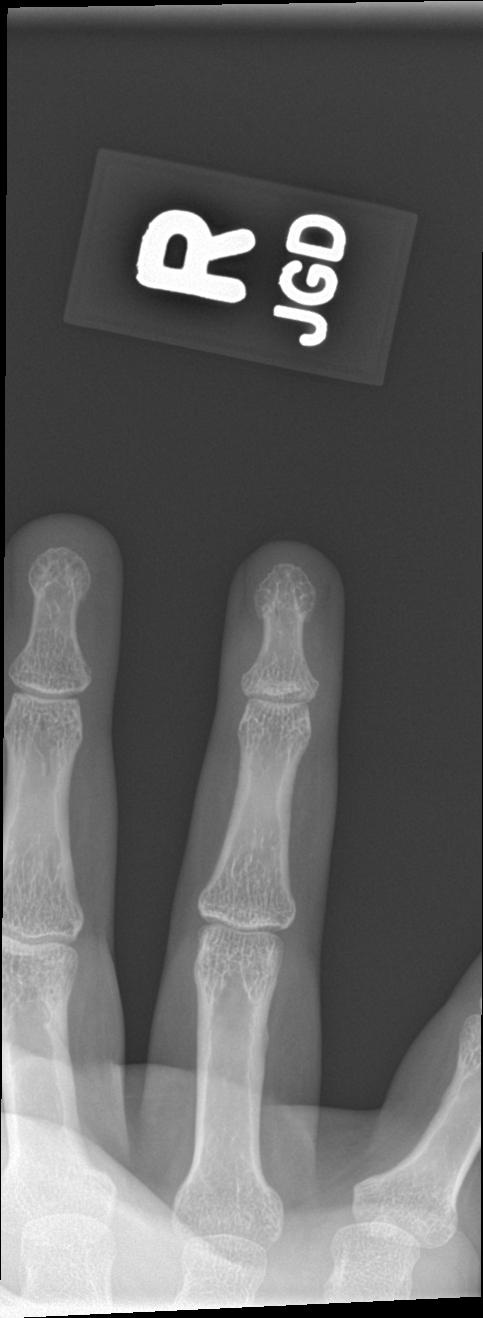

[4 of 4 positions shown; findings below may reference images not displayed]

FINDINGS: Nondisplaced intra-articular dorsal plate fracture in base of the
distal phalanx in right fourth finger with surrounding soft tissue
swelling. No dislocation. No additional fractures. No focal osseous
lesions. No radiopaque foreign bodies.
IMPRESSION: Nondisplaced intra-articular dorsal plate fracture in the base of
the distal phalanx in the right fourth finger.

## 2023-08-11 NOTE — Progress Notes (Signed)
Office Visit Note  Patient: Maria Sutton             Date of Birth: [birth year]           MRN: [id number]             PCP: Jeani Sow, MD Referring: Jeani Sow, MD Visit Date: 08/25/2023   Subjective:  Follow-up (Patient inquires if it is normal for her to have chills at night before going to bed. Patient states she has been more fatigued and out of breathe recently. )   History of Present Illness: Maria Sutton is a 27 y.o. 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 weekly folic acid 1 mg daily and as needed diclofenac 75 mg.  Joint pain has been reasonably well-controlled taking diclofenac 1 or 2 times per week.  Previous complaint is persistent fatigue.  She has experienced several episodes of nosebleeding.  This not painful and does not notice any sores or ulcers in the nose.  She is also been noticing some constitutional symptoms with chills at night going to bed also feeling more fatigued and short of breath quickly with exertion.  Previous HPI 05/26/2023 Maria Sutton is a 27 y.o. 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 weekly folic acid 1 mg daily and as needed diclofenac 75 mg.  She was sick with upper respiratory infection symptoms 1 time in late May after our last visit initially thought this was allergic rhinitis but then developed increased symptoms.  She was negative for viral or bacterial throat swab tests but ended up finishing a course of antibiotics and had cough that resolved about 3 weeks later.  Shoulder pain has been doing well with the diclofenac as needed.  Pain in her toe is also doing well and not having much swelling.  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  Has a lot of stress in her work situation.   Previous HPI 02/10/2023 Maria Sutton is a 27 y.o. 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 weekly  folic acid 1 mg daily and as needed diclofenac 75 mg.  Overall joint inflammation is improved compared to before still requires as needed diclofenac for shoulder pain but only provoked after more significant overexertion.  She is tolerating the medication okay but thinks she is having some mild side effects.  Experiencing photosensitivity with patchy or bumpy erythematous rashes on sun exposed areas this does better when using sunblock.  She has never noticed this type of rash with sun exposure previously.  Also has been sick with 3 rounds of what sounds like viral URI symptoms since our last visit most recent 1 resolved 3 to 4 weeks ago.   Previous HPI 11/11/22 Maria Sutton is a 27 y.o. female here for follow up for seropositive RA now on methotrexate 15 mg p.o. weekly and folic acid 1 mg daily and as needed diclofenac 75 mg p.o.  We started medication after telemedicine visit due to increased joint pain symptoms and new identified erosion at the left first MTP joint.  She is tolerating the methotrexate without any noticeable side effect.  She did have 1 probable cold or other upper respiratory infection that took a week to recover which is longer than usual otherwise no interval infections.  Symptoms are pretty well-controlled she is taking the diclofenac once every 2  to 3 days at this point.    Previous HPI 03/28/22 Maria Sutton is a 27 y.o. female here for follow up for seropositive RA on HCQ 200 mg daily.  She is overall doing fairly well most frequent problem have been exacerbations of right shoulder pain.  She notices this most often after a event where she can recall some overuse or catch full use applying a lot of pressure in the area.  She is getting more improvement with turmeric than NSAIDs as needed.  Also had a right ring finger distal fracture without complication.  She saw ophthalmology last year in May but not yet this year.     Previous HPI 09/10/21 Maria Sutton is a 27 y.o. female here  for follow up for seropositive RA on hydroxychloroquine 200 mg daily with new development of increased joint pain at several sites.  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  She was having only partial improvement with taking ibuprofen for the increased joint pain she increased her hydroxychloroquine to 2 tablets daily as well.  For the past week she is avoided any heavy overhead lifting and right shoulder pain has improved but is also developed increased symptoms in right wrist and left hand PIP joints.  These have not been associated with any significant swelling or erythema.   Previous HPI 04/23/21 Maria Sutton is a 27 y.o. female here for follow up for seropositive RA on hydroxychloroquine 200 mg PO daily.  Since her last visit she has not noticed any major flareup of joint pain stiffness or swelling.  She not had any more problems since lowering the hydroxychloroquine to the 1 tablet daily dose.  She stopped taking the medicine for a few periods of time I think she noticed some increased joint pains particularly of the shoulders this improved again.  She was seen at the ED for viral upper respiratory infection a few times in April but no other significant infections.   10/25/2020 Maria Sutton is a 27 y.o. 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 She decreased her dose of medication from 400mg  to 200mg  due to intolerance and noticed problems such as sleep disturbance, jaw pain, eye twitching she was not sure which related to the medication or underlying issues. She feels that symptoms are partially improved with this, particularly TMJ pain also her right arm pain is improved since last visit and has not had a repeat episode of sharp chest pain. She does continue to have  headaches and fatigue and right eyebrow twitching is worse.   DMARD Hx HCQ 08/2020-09/2022 Lack of efficacy MTX 09/2022-current   Labs 08/2020 ANA 1:320 speckled RF 42 CCP >250 SSA >8.0   08/2020 Vitamin D 26.7   Review of Systems  Constitutional:  Positive for fatigue.  HENT:  Negative for mouth sores and mouth dryness.   Eyes:  Negative for dryness.  Respiratory:  Positive for shortness of breath.   Cardiovascular:  Negative for chest pain and palpitations.  Gastrointestinal:  Negative for blood in stool, constipation and diarrhea.  Endocrine: Negative for increased urination.  Genitourinary:  Negative for involuntary urination.  Musculoskeletal:  Positive for joint pain, joint pain and muscle weakness. Negative for gait problem, joint swelling, myalgias, morning stiffness, muscle tenderness and myalgias.  Skin:  Negative for color change, rash, hair loss and sensitivity to sunlight.  Allergic/Immunologic: Negative for susceptible to infections.  Neurological:  Positive for dizziness and headaches.  Hematological:  Negative for swollen glands.  Psychiatric/Behavioral:  Positive for depressed mood and sleep disturbance. The patient is not nervous/anxious.     PMFS History:  Patient Active Problem List   Diagnosis Date Noted   Low serum cortisol level 08/26/2023   Other fatigue 08/26/2023   Pain in left shoulder 09/24/2022   High risk medication use 09/24/2022   Synovitis of right shoulder 06/24/2022   Plantar fascial fibromatosis of both feet 06/24/2022   Effusion of metatarsophalangeal (MTP) joint of great toe 06/24/2022   Pain in right shoulder 09/10/2021   Rheumatoid arthritis with rheumatoid factor of multiple sites without organ or systems involvement (HCC) 08/28/2020   Positive ANA (antinuclear antibody) 08/22/2020   Vitamin D deficiency 03/19/2019   Obesity with body mass index (BMI) of 30.0 to 39.9 03/19/2019   Reactive depression 09/25/2018   Migraine  without aura and without status migrainosus, not intractable 09/25/2018   Cognitive complaints 09/25/2018    Past Medical History:  Diagnosis Date   ADHD    Anxiety    Depression    Depression    Phreesia 08/07/2020   Headache    Lupus    PCOS (polycystic ovarian syndrome)    PTSD (post-traumatic stress disorder)    Rheumatoid arthritis (HCC)    Systemic lupus erythematosus (HCC)    Thyroid disease    hypothyroid    Family History  Problem Relation Age of Onset   Hypertension Mother    Breast cancer Other    History reviewed. No pertinent surgical history. Social History   Social History Narrative   Lives with mom and dad   Right handed   Drinks 1 cup caffeine daily      Dennie Bible great aunt an others w/breast ca    There is no immunization history on file for this patient.   Objective: Vital Signs: BP 124/85 (BP Location: Left Arm, Patient Position: Sitting, Cuff Size: Normal)   Pulse 85   Resp 14   Ht 5\' 7"  (1.702 m)   Wt 226 lb (102.5 kg)   LMP  (LMP Unknown)   BMI 35.40 kg/m    Physical Exam HENT:     Nose: Nose normal.     Mouth/Throat:     Mouth: Mucous membranes are moist.     Pharynx: Oropharynx is clear.  Eyes:     Conjunctiva/sclera: Conjunctivae normal.  Cardiovascular:     Rate and Rhythm: Normal rate and regular rhythm.  Pulmonary:     Effort: Pulmonary effort is normal.     Breath sounds: Normal breath sounds.  Lymphadenopathy:     Cervical: No cervical adenopathy.  Skin:    General: Skin is warm and dry.  Neurological:     Mental Status: She is alert.  Psychiatric:        Mood and Affect: Mood normal.      Musculoskeletal Exam:  Shoulders full ROM no tenderness or swelling Elbows full ROM no tenderness or swelling Wrists full ROM no tenderness or swelling Fingers full ROM no tenderness or swelling Knees full ROM no tenderness or swelling   Investigation: No additional findings.  Imaging: No results found.  Recent Labs: Lab  Results  Component Value Date   WBC 5.7 08/26/2023   HGB 15.3 08/26/2023   PLT 343 08/26/2023   NA 139 08/26/2023  K 4.1 08/26/2023   CL 104 08/26/2023   CO2 27 08/26/2023   GLUCOSE 94 08/26/2023   BUN 8 08/26/2023   CREATININE 0.82 08/26/2023   BILITOT 1.1 08/26/2023   ALKPHOS 63 06/25/2021   AST 15 08/26/2023   ALT 17 08/26/2023   PROT 7.6 08/26/2023   ALBUMIN 4.1 06/25/2021   CALCIUM 9.9 08/26/2023   GFRAA 137 08/10/2020    Speciality Comments: PLQ Eye Exam- [facility] 02/2021  Procedures:  No procedures performed Allergies: Patient has no active allergies.   Assessment / Plan:     Visit Diagnoses: Rheumatoid arthritis with rheumatoid factor of multiple sites without organ or systems involvement (HCC) - Plan: Sedimentation rate  RA appears controlled.  Checking sedimentation rate for disease activity monitoring.  With joint disease doing well I think this is unlikely to account for a significant amount of her fatigue.  Plan to continue methotrexate 15 mg p.o. weekly folic acid 1 mg daily diclofenac 75 mg as needed.  High risk medication use - methotrexate 15 mg p.o. weekly folic acid 1 mg daily and diclofenac 75 mg as needed. - Plan: CBC with Differential/Platelet, COMPLETE METABOLIC PANEL WITH GFR  Checking CBC and CMP for medication monitoring continue long-term use of methotrexate.  No serious interval infections.  Synovitis of right shoulder  Shoulder pain currently doing well, does have intermittent worsening with overuse.  Low serum cortisol level - Plan: ACTH, Cortisol Other fatigue - Plan: Iron, TIBC and Ferritin Panel  She is concerned about other causes for fatigue.  Also with multiple recent nosebleeds.  Checking iron panel.  As well as CBC as above.  Also will check ACTH and cortisol level for concern of possible low cortisol.  Discussed need for lab collection early in the morning for consistent result.  Vitamin D deficiency - Plan: VITAMIN D 25  Hydroxy (Vit-D Deficiency, Fractures)  Previously deficient in vitamin D discussed this could impact fatigue and joint disease.  Checking vitamin D level now.   Orders: Orders Placed This Encounter  Procedures   ACTH   Cortisol   Sedimentation rate   CBC with Differential/Platelet   COMPLETE METABOLIC PANEL WITH GFR   VITAMIN D 25 Hydroxy (Vit-D Deficiency, Fractures)   Iron, TIBC and Ferritin Panel   No orders of the defined types were placed in this encounter.    Follow-Up Instructions: Return in about 3 months (around 11/25/2023) for RA on MTX/NSAIDS f/u 3mos.   Fuller Plan, MD  Note - This record has been created using [employer].  Chart creation errors have been sought, but may not always  have been located. Such creation errors do not reflect on  the standard of medical care.

## 2023-08-15 ENCOUNTER — Other Ambulatory Visit: Payer: Self-pay | Admitting: Internal Medicine

## 2023-08-15 DIAGNOSIS — M0579 Rheumatoid arthritis with rheumatoid factor of multiple sites without organ or systems involvement: Secondary | ICD-10-CM

## 2023-08-15 NOTE — Telephone Encounter (Signed)
Last Fill: 05/26/2023  Labs: 05/26/2023 RBC 5.40 Hemoglobin 15.6 HCT 46.3  Next Visit: 08/25/2023  Last Visit: 05/26/2023  DX: Rheumatoid arthritis with rheumatoid factor of multiple sites without organ or systems involvement   Current Dose per office note 05/26/2023: methotrexate 15 mg p.o. weekly   Okay to refill Methotrexate?

## 2023-08-19 ENCOUNTER — Telehealth: Payer: Self-pay

## 2023-08-19 NOTE — Telephone Encounter (Signed)
Received a fax from [place] stating that they believed the patient was on methotrexate but not taking folic acid. After reviewing the chart, there was a prescription from Dr. Dimple Casey for folic acid sent in on 11/11/2022 for a year supply. Contacted the patient and patient states she is still taking folic acid and does not need a refill yet.

## 2023-08-25 ENCOUNTER — Ambulatory Visit: Payer: 59 | Attending: Internal Medicine | Admitting: Internal Medicine

## 2023-08-25 ENCOUNTER — Encounter: Payer: Self-pay | Admitting: Internal Medicine

## 2023-08-25 VITALS — BP 124/85 | HR 85 | Resp 14 | Ht 67.0 in | Wt 226.0 lb

## 2023-08-25 DIAGNOSIS — E559 Vitamin D deficiency, unspecified: Secondary | ICD-10-CM | POA: Diagnosis not present

## 2023-08-25 DIAGNOSIS — M65911 Unspecified synovitis and tenosynovitis, right shoulder: Secondary | ICD-10-CM | POA: Diagnosis not present

## 2023-08-25 DIAGNOSIS — Z79899 Other long term (current) drug therapy: Secondary | ICD-10-CM | POA: Diagnosis not present

## 2023-08-25 DIAGNOSIS — M0579 Rheumatoid arthritis with rheumatoid factor of multiple sites without organ or systems involvement: Secondary | ICD-10-CM | POA: Diagnosis not present

## 2023-08-25 DIAGNOSIS — R7989 Other specified abnormal findings of blood chemistry: Secondary | ICD-10-CM | POA: Diagnosis not present

## 2023-08-25 DIAGNOSIS — R5383 Other fatigue: Secondary | ICD-10-CM | POA: Diagnosis not present

## 2023-08-26 DIAGNOSIS — R5383 Other fatigue: Secondary | ICD-10-CM | POA: Insufficient documentation

## 2023-08-26 DIAGNOSIS — M0579 Rheumatoid arthritis with rheumatoid factor of multiple sites without organ or systems involvement: Secondary | ICD-10-CM | POA: Diagnosis not present

## 2023-08-26 DIAGNOSIS — R7989 Other specified abnormal findings of blood chemistry: Secondary | ICD-10-CM | POA: Insufficient documentation

## 2023-08-26 DIAGNOSIS — Z79899 Other long term (current) drug therapy: Secondary | ICD-10-CM | POA: Diagnosis not present

## 2023-08-26 DIAGNOSIS — E559 Vitamin D deficiency, unspecified: Secondary | ICD-10-CM | POA: Diagnosis not present

## 2023-08-29 ENCOUNTER — Other Ambulatory Visit: Payer: Self-pay | Admitting: Student

## 2023-08-29 DIAGNOSIS — N913 Primary oligomenorrhea: Secondary | ICD-10-CM

## 2023-08-29 DIAGNOSIS — Z01419 Encounter for gynecological examination (general) (routine) without abnormal findings: Secondary | ICD-10-CM

## 2023-08-29 DIAGNOSIS — Z3009 Encounter for other general counseling and advice on contraception: Secondary | ICD-10-CM

## 2023-08-31 LAB — IRON,TIBC AND FERRITIN PANEL
%SAT: 25 % (ref 16–45)
Ferritin: 43 ng/mL (ref 16–154)
Iron: 92 ug/dL (ref 40–190)
TIBC: 372 ug/dL (ref 250–450)

## 2023-08-31 LAB — COMPLETE METABOLIC PANEL WITH GFR
AG Ratio: 1.6 (calc) (ref 1.0–2.5)
ALT: 17 U/L (ref 6–29)
AST: 15 U/L (ref 10–30)
Albumin: 4.7 g/dL (ref 3.6–5.1)
Alkaline phosphatase (APISO): 71 U/L (ref 31–125)
BUN: 8 mg/dL (ref 7–25)
CO2: 27 mmol/L (ref 20–32)
Calcium: 9.9 mg/dL (ref 8.6–10.2)
Chloride: 104 mmol/L (ref 98–110)
Creat: 0.82 mg/dL (ref 0.50–0.96)
Globulin: 2.9 g/dL (ref 1.9–3.7)
Glucose, Bld: 94 mg/dL (ref 65–99)
Potassium: 4.1 mmol/L (ref 3.5–5.3)
Sodium: 139 mmol/L (ref 135–146)
Total Bilirubin: 1.1 mg/dL (ref 0.2–1.2)
Total Protein: 7.6 g/dL (ref 6.1–8.1)
eGFR: 101 mL/min/{1.73_m2} (ref >=60)

## 2023-08-31 LAB — CBC WITH DIFFERENTIAL/PLATELET
Absolute Lymphocytes: 1659 {cells}/uL (ref 850–3900)
Absolute Monocytes: 331 {cells}/uL (ref 200–950)
Basophils Absolute: 11 {cells}/uL (ref 0–200)
Basophils Relative: 0.2 %
Eosinophils Absolute: 68 {cells}/uL (ref 15–500)
Eosinophils Relative: 1.2 %
HCT: 46.1 % — ABNORMAL HIGH (ref 35.0–45.0)
Hemoglobin: 15.3 g/dL (ref 11.7–15.5)
MCH: 28.1 pg (ref 27.0–33.0)
MCHC: 33.2 g/dL (ref 32.0–36.0)
MCV: 84.7 fL (ref 80.0–100.0)
MPV: 10.6 fL (ref 7.5–12.5)
Monocytes Relative: 5.8 %
Neutro Abs: 3631 {cells}/uL (ref 1500–7800)
Neutrophils Relative %: 63.7 %
Platelets: 343 10*3/uL (ref 140–400)
RBC: 5.44 10*6/uL — ABNORMAL HIGH (ref 3.80–5.10)
RDW: 12.6 % (ref 11.0–15.0)
Total Lymphocyte: 29.1 %
WBC: 5.7 10*3/uL (ref 3.8–10.8)

## 2023-08-31 LAB — SEDIMENTATION RATE: Sed Rate: 22 mm/h — ABNORMAL HIGH (ref 0–20)

## 2023-08-31 LAB — CORTISOL: Cortisol, Plasma: 12.3 ug/dL

## 2023-08-31 LAB — ACTH: C206 ACTH: 12 pg/mL (ref 6–50)

## 2023-08-31 LAB — VITAMIN D 25 HYDROXY (VIT D DEFICIENCY, FRACTURES): Vit D, 25-Hydroxy: 18 ng/mL — ABNORMAL LOW (ref 30–100)

## 2023-09-01 ENCOUNTER — Telehealth: Payer: Self-pay

## 2023-09-01 ENCOUNTER — Other Ambulatory Visit: Payer: Self-pay | Admitting: *Deleted

## 2023-09-01 DIAGNOSIS — N913 Primary oligomenorrhea: Secondary | ICD-10-CM

## 2023-09-01 DIAGNOSIS — Z01419 Encounter for gynecological examination (general) (routine) without abnormal findings: Secondary | ICD-10-CM

## 2023-09-01 DIAGNOSIS — Z3009 Encounter for other general counseling and advice on contraception: Secondary | ICD-10-CM

## 2023-09-01 MED ORDER — SLYND 4 MG PO TABS
1.0000 | ORAL_TABLET | Freq: Every day | ORAL | 1 refills | Status: DC
Start: 2023-09-01 — End: 2023-10-27

## 2023-09-01 NOTE — Telephone Encounter (Signed)
Patient contacted the office and states she would like a review of her labs drawn on 08/26/2023. Please advise.

## 2023-09-01 NOTE — Progress Notes (Signed)
Pt requested RX refill SLYND. Annual scheduled for 10/27/23. Refill x 2 sent.

## 2023-09-08 ENCOUNTER — Telehealth: Payer: Self-pay

## 2023-09-08 ENCOUNTER — Other Ambulatory Visit: Payer: Self-pay | Admitting: Physician Assistant

## 2023-09-08 MED ORDER — PREDNISONE 5 MG PO TABS: ORAL_TABLET | ORAL | 0 refills | Status: DC

## 2023-09-08 NOTE — Telephone Encounter (Signed)
Patient reports she is experiencing pain in her right shoulder that is worsening. The pain worsens at night and the shoulder is warm to the touch and also appears slightly swollen. Patient states her right wrist also started hurting today but she denies swelling in the wrist. Patient has been taking methotrexate as prescribed and has not missed any doses. Since the shoulder has been flared, she has been taking the diclofenac BID and has taken ibuprofen 800mg  twice since Thanksgiving. Patient states she has tolerated prednisone well in the past. She uses [facility] on [address] In [place]. Patient would like to know any recommendations. Please advise. Thanks!

## 2023-09-08 NOTE — Progress Notes (Signed)
I received a message from the on-call service today at 5:33PM regarding the patient requesting a prescription of prednisone to be sent to the pharmacy. According to the patient she contacted the office earlier today experiencing a severe flare involving the right shoulder.  This weekend she was having to take diclofenac BID and ibuprofen 800 mg for breakthrough pain.  She has remained on methotrexate as prescribed without any gaps in therapy.   She requested a prednisone taper to alleviate her current symptoms.  Advised the patient to take prednisone in the morning with food and avoid the use of all NSAIDs while taking prednisone.  She voiced understanding.   A prednisone taper starting at 20 mg tapering by 5 mg every 3 days was sent to the pharmacy.  She was advised to notify us if her symptoms persist or worsen.   All questions were addressed.   Sherron Ales, PA-C

## 2023-09-14 ENCOUNTER — Telehealth: Payer: Self-pay | Admitting: Rheumatology

## 2023-09-14 NOTE — Telephone Encounter (Signed)
 Now addressed in result note

## 2023-09-14 NOTE — Telephone Encounter (Signed)
I received a phone call from the patient at 11:01 PM on September 13, 2023.  Patient stated that she is on prednisone taper and wanted to know if she can continue to take prednisone with methotrexate.  I advised her to continue to take prednisone while she is on methotrexate.  Patient voiced understanding. [name], MD

## 2023-09-14 NOTE — Progress Notes (Signed)
Sed rate of 22 is increased and would fit with RA activity as a cause for joint pain. Blood count and kidney and liver function are normal so no problem with methotrexate. Iron levels are normal. ACTH and cortisol are also normal. Vitamin D is deficient at 18. She should resume daily supplementation with 2000 units of vitamin D3.

## 2023-09-15 NOTE — Telephone Encounter (Signed)
See result note from 08/26/2023 for encounter.

## 2023-09-26 ENCOUNTER — Ambulatory Visit (INDEPENDENT_AMBULATORY_CARE_PROVIDER_SITE_OTHER): Payer: 59

## 2023-09-26 ENCOUNTER — Ambulatory Visit
Admission: EM | Admit: 2023-09-26 | Discharge: 2023-09-26 | Disposition: A | Discharge status: Home / Self Care | Payer: 59 | Attending: Family Medicine | Admitting: Family Medicine

## 2023-09-26 DIAGNOSIS — M25531 Pain in right wrist: Secondary | ICD-10-CM

## 2023-09-26 DIAGNOSIS — S60211A Contusion of right wrist, initial encounter: Secondary | ICD-10-CM | POA: Diagnosis not present

## 2023-09-26 NOTE — ED Triage Notes (Signed)
Pt reports right wrist pain x 1 day after she fell when she tried to hold a dog leash. Pt concern as she has Lupus. Pt states the tendon in her right wrist looks like pop out. Pt had diclofenac this morning, gives some relief.

## 2023-09-26 NOTE — ED Provider Notes (Addendum)
[facility]  Note:  This document was prepared using Conservation officer, historic buildings and may include unintentional dictation errors.  MRN: [id number] DOB: 1995/11/12  Subjective:   Maria Sutton is a 27 y.o. female presenting for 1 day history of acute onset persistent and worsening right wrist pain now noticing swelling of the ventral surface of her right wrist.  Symptoms started after she fell accidentally while trying to catch her dog.  Reports that she was holding the dog in her left hand when the dog pulled her and she ended up falling landing awkwardly making impact with her right wrist.  Cannot recall exactly how she injured the wrist.  She has taken some diclofenac with slight relief.  Has a history of rheumatoid arthritis and lupus.  About 2 weeks ago she finished a course of steroids.  No current facility-administered medications for this encounter.  Current Outpatient Medications:    diclofenac (CATAFLAM) 50 MG tablet, Take 50 mg by mouth 3 (three) times daily. (Patient not taking: Reported on 08/25/2023), Disp: , Rfl:    diclofenac (VOLTAREN) 75 MG EC tablet, Take 1 tablet (75 mg total) by mouth 2 (two) times daily as needed., Disp: 30 tablet, Rfl: 2   Drospirenone (SLYND) 4 MG TABS, Take 1 tablet (4 mg total) by mouth daily., Disp: 28 tablet, Rfl: 1   folic acid (FOLVITE) 1 MG tablet, Take 1 tablet (1 mg total) by mouth daily., Disp: 90 tablet, Rfl: 3   methotrexate (RHEUMATREX) 2.5 MG tablet, TAKE 6 TABLETS BY MOUTH ONCE A WEEK, Disp: 78 tablet, Rfl: 0   norethindrone-ethinyl estradiol-iron (ESTROSTEP FE) 1-20/1-30/1-35 MG-MCG tablet, Take 1 tablet by mouth daily., Disp: , Rfl:    predniSONE (DELTASONE) 5 MG tablet, Take 4 tabs po x 3 days, 3  tabs po x 3 days, 2  tabs po x 3 days, 1  tab po x 3 days, Disp: 30 tablet, Rfl: 0   No Known Allergies  Past Medical History:  Diagnosis Date   ADHD    Anxiety    Depression    Depression    Phreesia  08/07/2020   Headache    Lupus    PCOS (polycystic ovarian syndrome)    PTSD (post-traumatic stress disorder)    Rheumatoid arthritis (HCC)    Systemic lupus erythematosus (HCC)    Thyroid disease    hypothyroid     History reviewed. No pertinent surgical history.  Family History  Problem Relation Age of Onset   Hypertension Mother    Breast cancer Other     Social History   Tobacco Use   Smoking status: Never    Passive exposure: Never   Smokeless tobacco: Never  Vaping Use   Vaping status: Never Used  Substance Use Topics   Alcohol use: Yes    Comment: ocassional   Drug use: Never    ROS   Objective:   Vitals: BP (!) 141/89 (BP Location: Left Arm)   Pulse 89   Temp 98.9 F (37.2 C) (Oral)   Resp 16   SpO2 98%   Physical Exam Constitutional:      General: She is not in acute distress.    Appearance: Normal appearance. She is well-developed. She is not ill-appearing, toxic-appearing or diaphoretic.  HENT:     Head: Normocephalic and atraumatic.     Nose: Nose normal.     Mouth/Throat:     Mouth: Mucous membranes are moist.  Eyes:  General: No scleral icterus.       Right eye: No discharge.        Left eye: No discharge.     Extraocular Movements: Extraocular movements intact.  Cardiovascular:     Rate and Rhythm: Normal rate.  Pulmonary:     Effort: Pulmonary effort is normal.  Musculoskeletal:       Hands:  Skin:    General: Skin is warm and dry.  Neurological:     General: No focal deficit present.     Mental Status: She is alert and oriented to person, place, and time.  Psychiatric:        Mood and Affect: Mood normal.        Behavior: Behavior normal.    Applied a 2 inch Ace wrap to the right wrist.  Assessment and Plan :   PDMP not reviewed this encounter.  1. Contusion of right wrist, initial encounter   2. Right wrist pain    Patient declined prescription for prednisone. Recommended conservative management for right wrist  contusion.  Use RICE method, diclofenac for pain and inflammation. Counseled patient on potential for adverse effects with medications prescribed/recommended today, ER and return-to-clinic precautions discussed, patient verbalized understanding.  X-ray over-read was pending at time of discharge, recommended follow up with only abnormal results. Otherwise will not call for negative over-read. Patient was in agreement.    Wallis Bamberg, [place] 09/26/23 1105

## 2023-10-07 ENCOUNTER — Telehealth: Payer: Self-pay

## 2023-10-07 ENCOUNTER — Telehealth: Payer: Self-pay | Admitting: Physician Assistant

## 2023-10-07 ENCOUNTER — Other Ambulatory Visit: Payer: Self-pay | Admitting: Physician Assistant

## 2023-10-07 MED ORDER — PREDNISONE 5 MG PO TABS: ORAL_TABLET | ORAL | 0 refills | Status: DC

## 2023-10-07 NOTE — Telephone Encounter (Signed)
 Patient contacted the office and states she has talked to her pharmacy and was advised no prednisone  had been sent in yet. After reviewing the patient's chart, the encounter is in the lab note from 08/26/2023. Patient states she would like the prednisone  prescription and she would like it sent to [facility] on [address] in [place]. Please advise.

## 2023-10-07 NOTE — Telephone Encounter (Signed)
Patient states she has pain in her left shoulder, right wrist, and right index finger. Please advise.

## 2023-10-07 NOTE — Telephone Encounter (Signed)
 Patient called on-call service for a prescription of prednisone  to be sent to the pharmacy to treat her current flare.  A prednisone  taper starting at 20 mg tapering by 5 mg every 3 days was sent to the pharmacy. Advised the patent to avoid the use of NSAIDs while taking prednisone  and to schedule a follow up visit with Dr. Jeannetta for further evaluation due to the frequency of flares.   Waddell Craze, PA-C

## 2023-10-09 ENCOUNTER — Telehealth: Payer: Self-pay | Admitting: *Deleted

## 2023-10-09 DIAGNOSIS — M0579 Rheumatoid arthritis with rheumatoid factor of multiple sites without organ or systems involvement: Secondary | ICD-10-CM

## 2023-10-09 NOTE — Telephone Encounter (Signed)
 Patient has called office multiple times with no response. Patient called on-call 09/08/2023 and 10/07/2023.Ladona Ridgel sent a prednisone RX in for patient on 10/07/2023. Does patient need to be evaluated? Please advise.

## 2023-10-10 MED ORDER — PREDNISONE 5 MG PO TABS
5.0000 mg | ORAL_TABLET | Freq: Every day | ORAL | 0 refills | Status: DC
Start: 2023-10-18 — End: 2023-10-15

## 2023-10-10 NOTE — Addendum Note (Signed)
 Addended by: Fuller Plan on: 10/10/2023 08:13 AM   Modules accepted: Orders

## 2023-10-14 ENCOUNTER — Encounter: Payer: Self-pay | Admitting: Internal Medicine

## 2023-10-14 ENCOUNTER — Telehealth: Payer: Self-pay | Admitting: Internal Medicine

## 2023-10-14 DIAGNOSIS — M0579 Rheumatoid arthritis with rheumatoid factor of multiple sites without organ or systems involvement: Secondary | ICD-10-CM

## 2023-10-14 MED ORDER — CELECOXIB 200 MG PO CAPS
200.0000 mg | ORAL_CAPSULE | Freq: Two times a day (BID) | ORAL | 1 refills | Status: DC | PRN
Start: 2023-10-14 — End: 2024-06-21

## 2023-10-14 NOTE — Telephone Encounter (Signed)
 I recommend we need to see her back in the office during this time off to reassess symptoms, as she has had numerous exacerbations in the past few weeks. If anything opens up this week she is off work and should be able to come in. Otherwise we can just overbook sometime Monday the 13th.  FYI- I sent her a letter via MyChart indicating she will be out of work from now until her anticipated start of next week on Tuesday the 14th. I sent a prescription for 200 mg celebrex  to try taking up to twice daily in place of the prednisone  to see if this helps.

## 2023-10-14 NOTE — Telephone Encounter (Signed)
 Pt called asking if she could be put on Celebrex due to the prednisone is not working. Pt also would like a doctors note for work to be off for 10/14/23 through 10/17/23 due to her being in pain. Call back number is [phone].

## 2023-10-14 NOTE — Telephone Encounter (Signed)
 Pt called again requesting a call back as soon as possible. Pt would like a call back today if possible. Pt states the prednisone is not helping her. Call back number is [phone]

## 2023-10-14 NOTE — Telephone Encounter (Signed)
 Contacted the patient and patient states she does not feel the prednisone  is working. Patient states she was told about Celebrex  and was not sure if that is something she could be on. Patient states she also needs a doctor's note to okay her being off from today 10/14/2023 until Saturday 10/18/2023 because of the pain that she is in. Please advise.

## 2023-10-15 ENCOUNTER — Encounter: Payer: Self-pay | Admitting: Internal Medicine

## 2023-10-15 ENCOUNTER — Ambulatory Visit: Payer: No Typology Code available for payment source | Attending: Internal Medicine | Admitting: Internal Medicine

## 2023-10-15 VITALS — BP 123/88 | HR 83 | Resp 14 | Ht 67.0 in | Wt 228.0 lb

## 2023-10-15 DIAGNOSIS — Z79899 Other long term (current) drug therapy: Secondary | ICD-10-CM | POA: Diagnosis not present

## 2023-10-15 DIAGNOSIS — M0579 Rheumatoid arthritis with rheumatoid factor of multiple sites without organ or systems involvement: Secondary | ICD-10-CM

## 2023-10-15 LAB — SEDIMENTATION RATE: Sed Rate: 11 mm/h (ref 0–20)

## 2023-10-15 NOTE — Progress Notes (Signed)
 Office Visit Note  Patient: Maria Sutton             Date of Birth: 03/14/1996           MRN: [id number]             PCP: Wendolyn Jenkins Jansky, MD Referring: Wendolyn Jenkins Jansky, MD Visit Date: 10/15/2023   Subjective:  Follow-up (Patient states the Celebrex  is finally helping. )    Discussed the use of AI scribe software for clinical note transcription with the patient, who gave verbal consent to proceed.  History of Present Illness   Maria Sutton is a 28 y.o. 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 She has been experiencing increased symptoms since the beginning of December and again since around Christmas time. The pain has been persistent, varying in intensity but not completely subsiding. 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 They attempted to manage the pain with CBD, which had previously been effective for headaches and migraines, but found no relief. The pain was subtle during the day but intensified at night, often disrupting sleep. The pain would migrate between shoulders, the wrist, and a particular finger.  The patient works in research scientist (life sciences), frequently lifting and handling dogs. They speculated that the physical strain of the job might be contributing to the pain. Despite attempts to rest and reduce physical strain, the pain persisted.  The patient has been taking methotrexate  for their RA. Recent treatment with prednisone , taken at night to avoid daytime drowsiness, did not alleviate the pain much. They recently tried Celebrex  starting last night, which allowed them to sleep but just one night so far.  The patient noticed visible swelling in the wrist and finger, which persisted for over a week. Prior to Christmas, they sustained an injury at work while handling a large dog. They fell and landed on their wrist, which subsequently began to  hurt more. An x-ray at urgent care revealed no fractures, attributing the pain to inflammation.  The patient has been applying for jobs that would allow them to work from home, as they believe their current job is exacerbating their RA symptoms. They are also concerned about the increasing physical demands and risks of their current job.    Previous HPI 08/25/23 Maria Sutton is a 28 y.o. 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 weekly folic acid  1 mg daily and as needed diclofenac  75 mg.  Joint pain has been reasonably well-controlled taking diclofenac  1 or 2 times per week.  Previous complaint is persistent fatigue.  She has experienced several episodes of nosebleeding.  This not painful and does not notice any sores or ulcers in the nose.  She is also been noticing some constitutional symptoms with chills at night going to bed also feeling more fatigued and short of breath quickly with exertion.   Previous HPI 05/26/2023 Maria Sutton is a 28 y.o. 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 weekly folic acid  1 mg daily and as needed diclofenac  75 mg.  She was sick with upper respiratory infection symptoms 1 time in late May after our last visit initially thought this was allergic rhinitis but then developed increased symptoms.  She was negative for viral or bacterial throat swab tests but ended up finishing a course of antibiotics and  had cough that resolved about 3 weeks later.  Shoulder pain has been doing well with the diclofenac  as needed.  Pain in her toe is also doing well and not having much swelling.  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  Has a lot of stress in her work situation.   Previous HPI 02/10/2023 Maria Sutton is a 28 y.o. 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 weekly folic acid  1 mg daily and as needed diclofenac  75 mg.   Overall joint inflammation is improved compared to before still requires as needed diclofenac  for shoulder pain but only provoked after more significant overexertion.  She is tolerating the medication okay but thinks she is having some mild side effects.  Experiencing photosensitivity with patchy or bumpy erythematous rashes on sun exposed areas this does better when using sunblock.  She has never noticed this type of rash with sun exposure previously.  Also has been sick with 3 rounds of what sounds like viral URI symptoms since our last visit most recent 1 resolved 3 to 4 weeks ago.   Previous HPI 11/11/22 Maria Sutton is a 28 y.o. female here for follow up for seropositive RA now on methotrexate  15 mg p.o. weekly and folic acid  1 mg daily and as needed diclofenac  75 mg p.o.  We started medication after telemedicine visit due to increased joint pain symptoms and new identified erosion at the left first MTP joint.  She is tolerating the methotrexate  without any noticeable side effect.  She did have 1 probable cold or other upper respiratory infection that took a week to recover which is longer than usual otherwise no interval infections.  Symptoms are pretty well-controlled she is taking the diclofenac  once every 2 to 3 days at this point.    Previous HPI 03/28/22 Maria Sutton is a 28 y.o. female here for follow up for seropositive RA on HCQ 200 mg daily.  She is overall doing fairly well most frequent problem have been exacerbations of right shoulder pain.  She notices this most often after a event where she can recall some overuse or catch full use applying a lot of pressure in the area.  She is getting more improvement with turmeric than NSAIDs as needed.  Also had a right ring finger distal fracture without complication.  She saw ophthalmology last year in May but not yet this year.     Previous HPI 09/10/21 Maria Sutton is a 28 y.o. female here for follow up for seropositive RA on  hydroxychloroquine  200 mg daily with new development of increased joint pain at several sites.  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  She was having only partial improvement with taking ibuprofen  for the increased joint pain she increased her hydroxychloroquine  to 2 tablets daily as well.  For the past week she is avoided any heavy overhead lifting and right shoulder pain has improved but is also developed increased symptoms in right wrist and left hand PIP joints.  These have not been associated with any significant swelling or erythema.   Previous HPI 04/23/21 Maridel Pixler is a 28 y.o. female here for follow up for seropositive RA on hydroxychloroquine  200 mg PO daily.  Since her last visit she has not noticed any major  flareup of joint pain stiffness or swelling.  She not had any more problems since lowering the hydroxychloroquine  to the 1 tablet daily dose.  She stopped taking the medicine for a few periods of time I think she noticed some increased joint pains particularly of the shoulders this improved again.  She was seen at the ED for viral upper respiratory infection a few times in April but no other significant infections.   10/25/2020 Anaisha Mago is a 28 y.o. 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 She decreased her dose of medication from 400mg  to 200mg  due to intolerance and noticed problems such as sleep disturbance, jaw pain, eye twitching she was not sure which related to the medication or underlying issues. She feels that symptoms are partially improved with this, particularly TMJ pain also her right arm pain is improved since last visit and has not had a repeat episode of sharp chest pain. She does continue to have headaches and fatigue and right eyebrow  twitching is worse.   DMARD Hx HCQ 08/2020-09/2022 Lack of efficacy MTX 09/2022-current   Labs 08/2020 ANA 1:320 speckled RF 42 CCP >250 SSA >8.0   08/2020 Vitamin D  26.7   Review of Systems  Constitutional:  Positive for fatigue.  HENT:  Negative for mouth sores and mouth dryness.   Eyes:  Negative for dryness.  Respiratory:  Positive for shortness of breath.   Cardiovascular:  Negative for chest pain and palpitations.  Gastrointestinal:  Negative for blood in stool, constipation and diarrhea.  Endocrine: Negative for increased urination.  Genitourinary:  Negative for involuntary urination.  Musculoskeletal:  Positive for joint pain, joint pain, joint swelling, muscle weakness and muscle tenderness. Negative for gait problem, myalgias, morning stiffness and myalgias.  Skin:  Positive for rash. Negative for color change, hair loss and sensitivity to sunlight.  Allergic/Immunologic: Negative for susceptible to infections.  Neurological:  Positive for dizziness and headaches.  Hematological:  Negative for swollen glands.  Psychiatric/Behavioral:  Positive for depressed mood and sleep disturbance. The patient is nervous/anxious.     PMFS History:  Patient Active Problem List   Diagnosis Date Noted   Low serum cortisol level 08/26/2023   Other fatigue 08/26/2023   Pain in left shoulder 09/24/2022   High risk medication use 09/24/2022   Synovitis of right shoulder 06/24/2022   Plantar fascial fibromatosis of both feet 06/24/2022   Effusion of metatarsophalangeal (MTP) joint of great toe 06/24/2022   Pain in right shoulder 09/10/2021   Rheumatoid arthritis with rheumatoid factor of multiple sites without organ or systems involvement (HCC) 08/28/2020   Positive ANA (antinuclear antibody) 08/22/2020   Vitamin D  deficiency 03/19/2019   Obesity with body mass index (BMI) of 30.0 to 39.9 03/19/2019   Reactive depression 09/25/2018   Migraine without aura and without status  migrainosus, not intractable 09/25/2018   Cognitive complaints 09/25/2018    Past Medical History:  Diagnosis Date   ADHD    Anxiety    Depression    Depression    Phreesia 08/07/2020   Headache    Lupus    PCOS (polycystic ovarian syndrome)    PTSD (post-traumatic stress disorder)    Rheumatoid arthritis (HCC)    Systemic lupus erythematosus (HCC)    Thyroid  disease    hypothyroid    Family History  Problem Relation Age of Onset   Hypertension Mother    Breast cancer Other  History reviewed. No pertinent surgical history. Social History   Social History Narrative   Lives with mom and dad   Right handed   Drinks 1 cup caffeine daily      Bruna great aunt an others w/breast ca    There is no immunization history on file for this patient.   Objective: Vital Signs: BP 123/88 (BP Location: Left Arm, Patient Position: Sitting, Cuff Size: Normal)   Pulse 83   Resp 14   Ht 5' 7 (1.702 m)   Wt 228 lb (103.4 kg)   BMI 35.71 kg/m    Physical Exam Constitutional:      Appearance: She is obese.  Eyes:     Conjunctiva/sclera: Conjunctivae normal.  Cardiovascular:     Rate and Rhythm: Normal rate and regular rhythm.  Pulmonary:     Effort: Pulmonary effort is normal.     Breath sounds: Normal breath sounds.  Lymphadenopathy:     Cervical: No cervical adenopathy.  Skin:    General: Skin is warm and dry.  Neurological:     Mental Status: She is alert.  Psychiatric:        Mood and Affect: Mood normal.      Musculoskeletal Exam:  Shoulders full ROM, right shoulder posterior tenderness to pressure, and right traps and upper back paraspinal muscles Elbows full ROM no tenderness or swelling Wrists full ROM no tenderness or swelling Fingers full ROM no tenderness or swelling Knees full ROM no tenderness or swelling Ankles full ROM no tenderness or swelling  Investigation: No additional findings.  Imaging: DG Wrist Complete Right Result Date:  09/26/2023 CLINICAL DATA:  Pain after fall EXAM: RIGHT WRIST - COMPLETE 4 VIEW COMPARISON:  None Available. FINDINGS: There is no evidence of fracture or dislocation. There is no evidence of arthropathy or other focal bone abnormality. Soft tissues are unremarkable. If there is persistent pain or further concern of scaphoid injury recommend treatment with follow-up imaging in 7-10 days as these injuries can be acutely x-ray occult. IMPRESSION: No acute osseous abnormality Electronically Signed   By: Ranell Bring M.D.   On: 09/26/2023 12:19    Recent Labs: Lab Results  Component Value Date   WBC 5.7 08/26/2023   HGB 15.3 08/26/2023   PLT 343 08/26/2023   NA 139 08/26/2023   K 4.1 08/26/2023   CL 104 08/26/2023   CO2 27 08/26/2023   GLUCOSE 94 08/26/2023   BUN 8 08/26/2023   CREATININE 0.82 08/26/2023   BILITOT 1.1 08/26/2023   ALKPHOS 63 06/25/2021   AST 15 08/26/2023   ALT 17 08/26/2023   PROT 7.6 08/26/2023   ALBUMIN 4.1 06/25/2021   CALCIUM 9.9 08/26/2023   GFRAA 137 08/10/2020    Speciality Comments: PLQ Eye Exam- [facility] 02/2021  Procedures:  No procedures performed Allergies: Patient has no known allergies.   Assessment / Plan:     Visit Diagnoses: Rheumatoid arthritis with rheumatoid factor of multiple sites without organ or systems involvement (HCC) - Plan: Sedimentation rate Persistent pain in shoulders, wrist, and finger despite Methotrexate  therapy. Pain has been more persistent since December, with no relief from CBD. 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 Noted visible swelling in wrist and finger. Sedimentation rate was previously trending upward, suggesting inadequate control of RA. -Continue Methotrexate  15 mg PO weekly and folic acid  1 mg daily. -Add Celebrex  200 mg 1-2 times daily -Order lab work to recheck sedimentation rate. -Consider increasing Methotrexate   dose or switching to a different RA medication if  sedimentation rate remains elevated and symptoms persist.  Possible Carpal Tunnel Syndrome Patient reports pain in hand when writing or holding things for long periods. Ultrasound showed anatomical variant in median nerve, but no visible swelling. No numbness or weakness reported. -Consider further evaluation for carpal tunnel syndrome if symptoms worsen or persist, including possible electrical testing.  Physical Stress/Overuse Patient's job involves lifting heavy dogs, which may be contributing to musculoskeletal pain. -Advise patient to take breaks and avoid overuse when possible.    Orders: Orders Placed This Encounter  Procedures   Sedimentation rate   No orders of the defined types were placed in this encounter.    Follow-Up Instructions: Return in about 2 months (around 12/13/2023) for RA on MTX/change f/u 2mos.   Lonni LELON Ester, MD  Note - This record has been created using [employer].  Chart creation errors have been sought, but may not always  have been located. Such creation errors do not reflect on  the standard of medical care.

## 2023-10-16 ENCOUNTER — Telehealth: Payer: Self-pay

## 2023-10-16 DIAGNOSIS — M0579 Rheumatoid arthritis with rheumatoid factor of multiple sites without organ or systems involvement: Secondary | ICD-10-CM

## 2023-10-16 NOTE — Telephone Encounter (Signed)
 Patient contacted the office and states that her sedimentation rate lab result is back. Patient inquires if her methotrexate prescription will be increasing or staying the same. Please advise.

## 2023-10-20 NOTE — Telephone Encounter (Signed)
 Patient contacted the office again regarding her lab results and methotrexate. Please advise.

## 2023-10-21 MED ORDER — METHOTREXATE SODIUM 2.5 MG PO TABS
20.0000 mg | ORAL_TABLET | ORAL | 0 refills | Status: DC
Start: 2023-10-21 — End: 2023-10-22

## 2023-10-21 NOTE — Addendum Note (Signed)
 Addended by: Fuller Plan on: 10/21/2023 04:10 PM   Modules accepted: Orders

## 2023-10-21 NOTE — Telephone Encounter (Signed)
 Sedimentation rate remained normal so does not look too bad. I would wait on escalating to any much stronger medication right now. She should try increasing the methotrexate to 20 mg (8 tablets) weekly for now. And also taking the celebrex as needed.

## 2023-10-21 NOTE — Telephone Encounter (Signed)
 Patient advised Sedimentation rate remained normal so does not look too bad. Dr. Jeannetta would wait on escalating to any much stronger medication right now. She should try increasing the methotrexate  to 20 mg (8 tablets) weekly for now. And also taking the celebrex  as needed. Patient verbalized understanding.   Patient inquires if the injectable methotrexate  would be better while increasing the methotrexate . Patient states she is concerned about the increased dose messing with her stomach. Please advise.

## 2023-10-22 ENCOUNTER — Other Ambulatory Visit: Payer: Self-pay | Admitting: Internal Medicine

## 2023-10-22 MED ORDER — "TUBERCULIN SYRINGE 26G X 3/8"" 1 ML MISC": 0 refills | Status: DC

## 2023-10-22 MED ORDER — METHOTREXATE 25 MG/ML ~~LOC~~ SOSY: 20.0000 mg | PREFILLED_SYRINGE | SUBCUTANEOUS | 1 refills | Status: DC

## 2023-10-22 NOTE — Addendum Note (Signed)
 Addended by: Matt Song on: 10/22/2023 09:19 AM   Modules accepted: Orders

## 2023-10-22 NOTE — Telephone Encounter (Signed)
 Patient advised We did discuss that at the office if she is concerned about GI side effects. Dr. Rodell Citrin is sending a prescription now for that as an alternative. Her dose would be 0.8 mL for 20 mg. Patient verbalized understanding.

## 2023-10-22 NOTE — Telephone Encounter (Signed)
 We did discuss that at the office if she is concerned about GI side effects. I am sending a prescription now for that as an alternative. Her dose would be 0.8 mL for 20 mg.

## 2023-10-23 NOTE — Telephone Encounter (Signed)
Patient called, patient requesting pills instead of vial and syringe. Please advise. Thank you.  Last Fill: 10/21/2023 discontinued  Labs: 08/26/2023  Sed rate of 22 is increased and would fit with RA activity as a cause for joint pain. Blood count and kidney and liver function are normal so no problem with methotrexate. Iron levels are normal. ACTH and cortisol are also normal. Vitamin D is deficient at 18. She should resume daily supplementation with 2000 units of vitamin D3.  Next Visit: 12/15/2023  Last Visit: 10/15/2023  DX: Rheumatoid arthritis with rheumatoid factor of multiple sites without organ or systems involvement   Current Dose per office note 10/15/2023: Continue Methotrexate 15 mg PO weekly    Okay to refill Methotrexate?

## 2023-10-27 ENCOUNTER — Other Ambulatory Visit (HOSPITAL_COMMUNITY)
Admission: RE | Admit: 2023-10-27 | Discharge: 2023-10-27 | Disposition: A | Discharge status: Home / Self Care | Payer: No Typology Code available for payment source | Source: Ambulatory Visit | Attending: Advanced Practice Midwife | Admitting: Advanced Practice Midwife

## 2023-10-27 ENCOUNTER — Encounter: Payer: Self-pay | Admitting: Advanced Practice Midwife

## 2023-10-27 ENCOUNTER — Ambulatory Visit: Payer: No Typology Code available for payment source | Admitting: Advanced Practice Midwife

## 2023-10-27 VITALS — BP 131/88 | HR 92 | Ht 67.0 in | Wt 231.3 lb

## 2023-10-27 DIAGNOSIS — N913 Primary oligomenorrhea: Secondary | ICD-10-CM

## 2023-10-27 DIAGNOSIS — Z1151 Encounter for screening for human papillomavirus (HPV): Secondary | ICD-10-CM | POA: Diagnosis not present

## 2023-10-27 DIAGNOSIS — Z3041 Encounter for surveillance of contraceptive pills: Secondary | ICD-10-CM

## 2023-10-27 DIAGNOSIS — Z01419 Encounter for gynecological examination (general) (routine) without abnormal findings: Secondary | ICD-10-CM

## 2023-10-27 DIAGNOSIS — F3289 Other specified depressive episodes: Secondary | ICD-10-CM

## 2023-10-27 DIAGNOSIS — Z1339 Encounter for screening examination for other mental health and behavioral disorders: Secondary | ICD-10-CM

## 2023-10-27 DIAGNOSIS — N920 Excessive and frequent menstruation with regular cycle: Secondary | ICD-10-CM | POA: Diagnosis not present

## 2023-10-27 DIAGNOSIS — Z3009 Encounter for other general counseling and advice on contraception: Secondary | ICD-10-CM | POA: Diagnosis not present

## 2023-10-27 DIAGNOSIS — N922 Excessive menstruation at puberty: Secondary | ICD-10-CM

## 2023-10-27 MED ORDER — SLYND 4 MG PO TABS
1.0000 | ORAL_TABLET | Freq: Every day | ORAL | 1 refills | Status: DC
Start: 2023-10-27 — End: 2023-12-10

## 2023-10-27 NOTE — Progress Notes (Signed)
No c/o today. Plans to stay on same BCM. Declines STI testing.

## 2023-10-27 NOTE — Progress Notes (Signed)
ANNUAL EXAM Patient name: Maria Sutton MRN [id number]  Date of birth: 1996-01-01 Chief Complaint:   Gynecologic Exam  History of Present Illness:   Maria Sutton is a 28 y.o. G0P0000 Hispanic female being seen today for a routine annual exam.  Current complaints: presents for annual gyn. Patient has a long h/o  rheumatological conditions including Lupus and RA and is experiencing recurrent flares causing increased anxiety and depression r/t her job and accomodation. She is established with her Rheumatologist and has a good repore with him. She has a physically demanding job- intermittent LOA (not accommodating) Looking for a more accommodating job and possibly a remote position. Attributes her  stress and intermittent depression to her job issues with recurrent RA flares.  Requests refill on Slynd working well previously and wants to continue . H/O menomennoragia now resolved with Slynd Not currently in a  relationship at this time. Declines STI testing   Patient's last menstrual period was 10/07/2021.   [facility] - 10/27/23 1314       Pregnancy Intention Screening   Does the patient want to become pregnant in the next year? No    Does the patient's partner want to become pregnant in the next year? No    Would the patient like to discuss contraceptive options today? No      Contraception Wrap Up   Current Method Oral Contraceptive    End Method Oral Contraceptive    Contraception Counseling Provided Yes            The pregnancy intention screening data noted above was reviewed. Potential methods of contraception were discussed. The patient elected to proceed with Oral Contraceptive.   Last pap 2021 NILM. Results were:  NILM . H/O abnormal pap: no Last mammogram: NA. Results were: N/A.  Last colonoscopy: NA. Results were: N/A.      10/27/2023    1:23 PM 09/16/2022    8:25 AM 05/13/2022    9:01 AM 08/15/2020   11:14 AM 08/10/2020   11:25 AM  Depression screen PHQ 2/9   Decreased Interest 2 1 1  0 0  Down, Depressed, Hopeless 2 1 1  0 0  PHQ - 2 Score 4 2 2  0 0  Altered sleeping 2 1 1     Tired, decreased energy 3 1 1     Change in appetite 1 1 0    Feeling bad or failure about yourself  3 1 1     Trouble concentrating 1 1 1     Moving slowly or fidgety/restless 1 0 0    Suicidal thoughts 0 0 0    PHQ-9 Score 15 7 6     Difficult doing work/chores   Not difficult at all          10/27/2023    1:25 PM 09/16/2022    8:27 AM  GAD 7 : Generalized Anxiety Score  Nervous, Anxious, on Edge 1 1  Control/stop worrying 1 0  Worry too much - different things 1 1  Trouble relaxing 2 1  Restless 0 1  Easily annoyed or irritable 3 1  Afraid - awful might happen 0 0  Total GAD 7 Score 8 5  Anxiety Difficulty  Somewhat difficult     Review of Systems:   Pertinent items are noted in HPI Denies any headaches, blurred vision, fatigue, shortness of breath, chest pain, abdominal pain, abnormal vaginal discharge/itching/odor/irritation, problems with periods, bowel movements, urination, or intercourse unless otherwise stated above.  Pertinent History Reviewed:  Reviewed past medical,surgical,  social and family history.  Reviewed problem list, medications and allergies.  Physical Assessment:   Vitals:   10/27/23 1303  BP: 131/88  Pulse: 92  Weight: 231 lb 4.8 oz (104.9 kg)  Height: 5\' 7"  (1.702 m)   Body mass index is 36.23 kg/m.   Physical Examination:  General appearance - well appearing, and in no distress Mental status - alert, oriented to person, place, and time Psych:  She has a normal mood and affect Skin - warm and dry, normal color, no suspicious lesions noted Chest - effort normal, no problems with respiration noted Heart - normal rate and regular rhythm Neck:  midline trachea, no thyromegaly or nodules Breasts - breasts appear normal, no suspicious masses, no skin or nipple changes or  axillary nodes Abdomen - soft, nontender, nondistended,  no masses or organomegaly BS x 4 Pelvic - VULVA: normal appearing vulva with no masses, tenderness or lesions   VAGINA: normal appearing vagina with normal color and discharge, no lesions   CERVIX: normal appearing cervix without discharge or lesions, no CMT Thin prep pap is done with reflex Extremities:  No swelling or varicosities noted  Chaperone present for exam   Assessment & Plan:       Encounter for well woman exam with routine gynecological exam - Plan: Ambulatory referral to [facility], Cytology - PAP( [place]), Cervicovaginal ancillary only( [place]), Drospirenone (SLYND) 4 MG TABS  Women's annual routine gynecological examination  Primary oligomenorrhea - Plan: Drospirenone (SLYND) 4 MG TABS  Unwanted fertility - Plan: Drospirenone (SLYND) 4 MG TABS  Excessive menstruation at puberty - Plan: Drospirenone (SLYND) 4 MG TABS  Other depression  Encounter for surveillance of contraceptive pills - Referral to behavior health prn (patient will consider)  - Will follow up results of pap smear and manage accordingly  - Mammogram scheduled  - Routine preventative health maintenance measures emphasized.  - Please refer to After Visit Summary for other counseling recommendations.       Mammogram: @ 27yo, or sooner if problems Colonoscopy: @ 27yo, or sooner if problems  Orders Placed This Encounter  Procedures   Ambulatory referral to [facility]    Meds:  Meds ordered this encounter  Medications   Drospirenone (SLYND) 4 MG TABS    Sig: Take 1 tablet (4 mg total) by mouth daily.    Dispense:  90 tablet    Refill:  1    Supervising Provider:   Reva Bores [[id number]]    Follow-up: Return for ANNUAL.  Colman Cater, NP 10/27/2023 2:53 PM

## 2023-10-28 ENCOUNTER — Ambulatory Visit (INDEPENDENT_AMBULATORY_CARE_PROVIDER_SITE_OTHER): Payer: No Typology Code available for payment source | Admitting: Licensed Clinical Social Worker

## 2023-10-28 ENCOUNTER — Encounter: Payer: Self-pay | Admitting: Advanced Practice Midwife

## 2023-10-28 DIAGNOSIS — F4323 Adjustment disorder with mixed anxiety and depressed mood: Secondary | ICD-10-CM

## 2023-10-28 LAB — CERVICOVAGINAL ANCILLARY ONLY
Bacterial Vaginitis (gardnerella): NEGATIVE
Candida Glabrata: NEGATIVE
Candida Vaginitis: NEGATIVE
Chlamydia: NEGATIVE
Comment: NEGATIVE
Comment: NEGATIVE
Comment: NEGATIVE
Comment: NEGATIVE
Comment: NEGATIVE
Comment: NORMAL
Neisseria Gonorrhea: NEGATIVE
Trichomonas: NEGATIVE

## 2023-10-28 NOTE — BH Specialist Note (Signed)
Integrated Behavioral Health via Telemedicine Visit  11/03/2023 Maria Sutton [id number]  Number of Integrated Behavioral Health Clinician visits: 1- Initial Visit  Session Start time: 0815    Session End time: 0856  Total time in minutes: 41   Referring Provider: Dustin Folks, NP Patient/Family location: In her car [facility] Provider location: At Home All persons participating in visit: [facility] and Patient  Types of Service: Individual psychotherapy and Video visit  I connected with Maria Sutton and/or Maria Sutton patient via  Telephone or Video Enabled Telemedicine Application  (Video is Caregility application) and verified that I am speaking with the correct person using two identifiers. Discussed confidentiality: Yes   I discussed the limitations of telemedicine and the availability of in person appointments.  Discussed there is a possibility of technology failure and discussed alternative modes of communication if that failure occurs.  I discussed that engaging in this telemedicine visit, they consent to the provision of behavioral healthcare and the services will be billed under their insurance.  Patient and/or legal guardian expressed understanding and consented to Telemedicine visit: Yes   Presenting Concerns: Patient and/or family reports the following symptoms/concerns: increased depression and anxiety Duration of problem: Months; Severity of problem: moderate  Patient and/or Family's Strengths/Protective Factors: Social and Emotional competence, Concrete supports in place (healthy food, safe environments, etc.), and Physical Health (exercise, healthy diet, medication compliance, etc.)  Goals Addressed: Patient will:  Reduce symptoms of: depression   Increase knowledge and/or ability of: coping skills, healthy habits, and self-management skills   Demonstrate ability to: Increase healthy adjustment to current life circumstances  Progress towards  Goals: Ongoing  Interventions: Interventions utilized:  Mindfulness or Management consultant, Supportive Counseling, Psychoeducation and/or Health Education, and Supportive Reflection Standardized Assessments completed: Not Needed  Patient and/or Family Response: The patient attended today's virtual session and shared her history of anxiety and depression symptoms, which she attributes to her physical health challenges. She was diagnosed with lupus and arthritis three years ago and takes a weekly chemo pill, which leaves her feeling extremely sick. She expressed frustration with having to continually remind her friends and family of her physical health concerns, as they often forget due to the invisible nature of her symptoms. The patient also noted that her family's cultural dynamics lead to a lack of understanding regarding both her physical and mental health struggles. The patient processed stressors related to her work environment, especially the physical demands of her role in light of her health issues. She reported heightened stress from needing to constantly remind others of her condition and is actively searching for a work-from-home job. The patient identified crying and watching funny videos as her primary coping strategies and explored additional options, such as getting massages, drawing, and spending more time with her parents.  Assessment: Patient currently experiencing anxiety and depression related to her chronic health conditions, including lupus and arthritis, which require ongoing treatment and leave her feeling physically sick. She struggles with a lack of understanding from her family about both her mental and physical health challenges, and she faces increased stress from the demands of her work environment and the need to constantly remind others of her condition..   Patient may benefit from continued support of integrated behavioral health services.  Plan: Follow up with  behavioral health clinician on : 11/10/23  Behavioral recommendations: Mylia continue exploring and incorporating healthy coping strategies, such as engaging in creative activities like drawing and seeking regular relaxation practices like massages, to manage stress and emotional discomfort.  Additionally, pursuing a work-from-home job to reduce the physical demands of her current role may help alleviate stress and improve her overall well-being.  Referral(s): Integrated [employer] (In Clinic)  I discussed the assessment and treatment plan with the patient and/or parent/guardian. They were provided an opportunity to ask questions and all were answered. They agreed with the plan and demonstrated an understanding of the instructions.   They were advised to call back or seek an in-person evaluation if the symptoms worsen or if the condition fails to improve as anticipated.  Roselene Gray Cruzita Lederer, LCSWA

## 2023-10-30 LAB — CYTOLOGY - PAP
Adequacy: ABSENT
Diagnosis: NEGATIVE

## 2023-11-01 ENCOUNTER — Encounter: Payer: Self-pay | Admitting: Nurse Practitioner

## 2023-11-05 ENCOUNTER — Ambulatory Visit: Payer: No Typology Code available for payment source | Admitting: Obstetrics and Gynecology

## 2023-11-10 ENCOUNTER — Ambulatory Visit (INDEPENDENT_AMBULATORY_CARE_PROVIDER_SITE_OTHER): Payer: No Typology Code available for payment source | Admitting: Licensed Clinical Social Worker

## 2023-11-10 DIAGNOSIS — F4323 Adjustment disorder with mixed anxiety and depressed mood: Secondary | ICD-10-CM

## 2023-11-17 ENCOUNTER — Ambulatory Visit: Payer: 59 | Admitting: Internal Medicine

## 2023-11-24 ENCOUNTER — Encounter: Payer: No Typology Code available for payment source | Admitting: Licensed Clinical Social Worker

## 2023-12-02 NOTE — Progress Notes (Signed)
 Office Visit Note  Patient: Maria Sutton             Date of Birth: [birth year]           MRN: [id number]             PCP: Jeani Sow, MD Referring: Jeani Sow, MD Visit Date: 12/15/2023   Subjective:  Follow-up (Patient states she has to stop the Celebrex because it was causing her to have digestion problems. )    Discussed the use of AI scribe software for clinical note transcription with the patient, who gave verbal consent to proceed.  History of Present Illness   Maria Sutton is a 28 y.o. 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 We tried addition of celebrex 200 mg PRN for breakthrough joint pain especially in right arm and with use.  She experiences gastrointestinal issues, including upset stomach and nausea, which worsened significantly about two weeks ago while taking Celebrex. The symptoms became severe enough to prevent her from eating properly, as food would not digest. She discontinued Celebrex, leading to a slight return of inflammation symptoms, but noted less frequent stomach issues since stopping, although some nausea persists.  She continues to take methotrexate, which causes milder stomach upset, particularly on Sundays when she takes it. She has not tried methotrexate injections due to discomfort with self-administration. She takes all doses at once, causing symptoms about an hour after ingestion, which improve throughout the day.  Regarding her rheumatoid arthritis, she reports no visible swelling but describes intermittent pain in her elbow, initially thought to be joint-related but later identified as localized pain. She experiences muscle weakness, particularly when lifting or running, where her muscles 'just give out.'  She has a history of IBS but has not experienced significant reflux or stomach issues prior to the recent events. Apples help alleviate some of her nausea. She describes  her experience with Celebrex as having 'good stomach days or bad stomach days,' with more frequent bad days when on the medication. No burning sensation associated with acid reflux, and ginger ale helps with her symptoms. She has not been taking any medication for stomach acid, such as Pepcid or omeprazole, as she does not experience burning sensations typical of acid reflux.       Previous HPI 10/15/2023 Maria Sutton is a 28 y.o. female here for follow up for seropositive RA on methotrexate 15 mg PO weekly and folic acid 1 mg daily. She has been experiencing increased symptoms since the beginning of December and again since around Christmas time. The pain has been persistent, varying in intensity but not completely subsiding. 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 They attempted to manage the pain with CBD, which had previously been effective for headaches and migraines, but found no relief. The pain was subtle during the day but intensified at night, often disrupting sleep. The pain would migrate between shoulders, the wrist, and a particular finger.   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 They speculated that the physical strain of the job might be contributing to the pain. Despite attempts to rest and reduce physical strain, the pain persisted.   The patient has been taking methotrexate for their RA. Recent treatment with prednisone, taken at night to avoid daytime drowsiness, did not alleviate the pain much. They recently tried  Celebrex starting last night, which allowed them to sleep but just one night so far.   The patient noticed visible swelling in the wrist and finger, which persisted for over a week. Prior to Christmas, they sustained an injury at work while handling a large dog. They fell and landed on their wrist, which subsequently began to hurt more. An x-ray at urgent care  revealed no fractures, attributing the pain to inflammation.   The patient has been applying for jobs that would allow them to work from home, as they believe their current job is exacerbating their RA symptoms. They are also concerned about the increasing physical demands and risks of their current job.      Previous HPI 08/25/23 Maria Sutton is a 28 y.o. 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 weekly folic acid 1 mg daily and as needed diclofenac 75 mg.  Joint pain has been reasonably well-controlled taking diclofenac 1 or 2 times per week.  Previous complaint is persistent fatigue.  She has experienced several episodes of nosebleeding.  This not painful and does not notice any sores or ulcers in the nose.  She is also been noticing some constitutional symptoms with chills at night going to bed also feeling more fatigued and short of breath quickly with exertion.   Previous HPI 05/26/2023 Maria Sutton is a 28 y.o. 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 weekly folic acid 1 mg daily and as needed diclofenac 75 mg.  She was sick with upper respiratory infection symptoms 1 time in late May after our last visit initially thought this was allergic rhinitis but then developed increased symptoms.  She was negative for viral or bacterial throat swab tests but ended up finishing a course of antibiotics and had cough that resolved about 3 weeks later.  Shoulder pain has been doing well with the diclofenac as needed.  Pain in her toe is also doing well and not having much swelling.  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  Has a lot of stress in her work situation.   Previous HPI 02/10/2023 Maria Sutton is a 28 y.o. 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 weekly folic acid 1 mg daily and as needed diclofenac 75 mg.  Overall joint inflammation is improved  compared to before still requires as needed diclofenac for shoulder pain but only provoked after more significant overexertion.  She is tolerating the medication okay but thinks she is having some mild side effects.  Experiencing photosensitivity with patchy or bumpy erythematous rashes on sun exposed areas this does better when using sunblock.  She has never noticed this type of rash with sun exposure previously.  Also has been sick with 3 rounds of what sounds like viral URI symptoms since our last visit most recent 1 resolved 3 to 4 weeks ago.   Previous HPI 11/11/22 Maria Sutton is a 28 y.o. female here for follow up for seropositive RA now on methotrexate 15 mg p.o. weekly and folic acid 1 mg daily and as needed diclofenac 75 mg p.o.  We started medication after telemedicine visit due to increased joint pain symptoms and new identified erosion at the left first MTP joint.  She is tolerating the methotrexate without any noticeable side effect.  She did have 1 probable cold or other upper respiratory infection that took a week  to recover which is longer than usual otherwise no interval infections.  Symptoms are pretty well-controlled she is taking the diclofenac once every 2 to 3 days at this point.    Previous HPI 03/28/22 Maria Sutton is a 28 y.o. female here for follow up for seropositive RA on HCQ 200 mg daily.  She is overall doing fairly well most frequent problem have been exacerbations of right shoulder pain.  She notices this most often after a event where she can recall some overuse or catch full use applying a lot of pressure in the area.  She is getting more improvement with turmeric than NSAIDs as needed.  Also had a right ring finger distal fracture without complication.  She saw ophthalmology last year in May but not yet this year.     Previous HPI 09/10/21 Maria Sutton is a 28 y.o. female here for follow up for seropositive RA on hydroxychloroquine 200 mg daily with new development of  increased joint pain at several sites.  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  She was having only partial improvement with taking ibuprofen for the increased joint pain she increased her hydroxychloroquine to 2 tablets daily as well.  For the past week she is avoided any heavy overhead lifting and right shoulder pain has improved but is also developed increased symptoms in right wrist and left hand PIP joints.  These have not been associated with any significant swelling or erythema.   Previous HPI 04/23/21 Maria Sutton is a 28 y.o. female here for follow up for seropositive RA on hydroxychloroquine 200 mg PO daily.  Since her last visit she has not noticed any major flareup of joint pain stiffness or swelling.  She not had any more problems since lowering the hydroxychloroquine to the 1 tablet daily dose.  She stopped taking the medicine for a few periods of time I think she noticed some increased joint pains particularly of the shoulders this improved again.  She was seen at the ED for viral upper respiratory infection a few times in April but no other significant infections.   10/25/2020 Maria Sutton is a 28 y.o. 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 She decreased her dose of medication from 400mg  to 200mg  due to intolerance and noticed problems such as sleep disturbance, jaw pain, eye twitching she was not sure which related to the medication or underlying issues. She feels that symptoms are partially improved with this, particularly TMJ pain also her right arm pain is improved since last visit and has not had a repeat episode of sharp chest pain. She does continue to have headaches and fatigue and right eyebrow twitching is worse.   DMARD Hx HCQ 08/2020-09/2022  Lack of efficacy MTX 09/2022-current   Labs 08/2020 ANA 1:320 speckled RF 42 CCP >250 SSA >8.0   08/2020 Vitamin D 26.7   Review of Systems  Constitutional:  Positive for fatigue.  HENT:  Positive for mouth dryness. Negative for mouth sores.   Eyes:  Negative for dryness.  Respiratory:  Positive for shortness of breath.   Cardiovascular:  Negative for chest pain and palpitations.  Gastrointestinal:  Negative for blood in stool, constipation and diarrhea.  Endocrine: Negative for increased urination.  Genitourinary:  Negative for involuntary urination.  Musculoskeletal:  Positive for joint pain, joint pain, joint swelling, myalgias, muscle weakness, muscle tenderness and myalgias. Negative for gait problem and morning stiffness.  Skin:  Positive for rash. Negative for color change, hair loss and sensitivity to sunlight.  Allergic/Immunologic: Negative for susceptible to infections.  Neurological:  Positive for dizziness and headaches.  Hematological:  Negative for swollen glands.  Psychiatric/Behavioral:  Positive for depressed mood and sleep disturbance. The patient is nervous/anxious.     PMFS History:  Patient Active Problem List   Diagnosis Date Noted   Low serum cortisol level 08/26/2023   Other fatigue 08/26/2023   Pain in left shoulder 09/24/2022   High risk medication use 09/24/2022   Synovitis of right shoulder 06/24/2022   Plantar fascial fibromatosis of both feet 06/24/2022   Effusion of metatarsophalangeal (MTP) joint of great toe 06/24/2022   Pain in right shoulder 09/10/2021   Rheumatoid arthritis with rheumatoid factor of multiple sites without organ or systems involvement (HCC) 08/28/2020   Positive ANA (antinuclear antibody) 08/22/2020   Vitamin D deficiency 03/19/2019   Obesity with body mass index (BMI) of 30.0 to 39.9 03/19/2019   Reactive depression 09/25/2018   Migraine without aura and without status migrainosus, not intractable 09/25/2018    Cognitive complaints 09/25/2018    Past Medical History:  Diagnosis Date   ADHD    Anxiety    Depression    Depression    Phreesia 08/07/2020   Headache    Lupus    PCOS (polycystic ovarian syndrome)    PTSD (post-traumatic stress disorder)    Rheumatoid arthritis (HCC)    Systemic lupus erythematosus (HCC)    Thyroid disease    hypothyroid    Family History  Problem Relation Age of Onset   Hypertension Mother    Breast cancer Other    History reviewed. No pertinent surgical history. Social History   Social History Narrative   Lives with mom and dad   Right handed   Drinks 1 cup caffeine daily      Dennie Bible great aunt an others w/breast ca    There is no immunization history on file for this patient.   Objective: Vital Signs: BP 125/86 (BP Location: Left Arm, Patient Position: Sitting, Cuff Size: Large)   Pulse 82   Resp 14   Ht 5\' 7"  (1.702 m)   Wt 229 lb (103.9 kg)   BMI 35.87 kg/m    Physical Exam Constitutional:      Appearance: She is obese.  Eyes:     Conjunctiva/sclera: Conjunctivae normal.  Cardiovascular:     Rate and Rhythm: Normal rate and regular rhythm.  Pulmonary:     Effort: Pulmonary effort is normal.     Breath sounds: Normal breath sounds.  Musculoskeletal:     Right lower leg: No edema.     Left lower leg: No edema.  Lymphadenopathy:     Cervical: No cervical adenopathy.  Skin:    General: Skin is warm and dry.     Findings: No rash.  Neurological:     Mental Status: She is alert.  Psychiatric:        Mood and Affect: Mood normal.      Musculoskeletal Exam:  Shoulders full ROM no tenderness or swelling Elbows full ROM no tenderness or swelling Wrists full ROM no tenderness or swelling Fingers full ROM no tenderness or swelling Knees full ROM no tenderness or swelling   Investigation: No additional findings.  Imaging: No results found.  Recent Labs:  Lab Results  Component Value Date   WBC 5.7 08/26/2023   HGB 15.3  08/26/2023   PLT 343 08/26/2023   NA 139 08/26/2023   K 4.1 08/26/2023   CL 104 08/26/2023   CO2 27 08/26/2023   GLUCOSE 94 08/26/2023   BUN 8 08/26/2023   CREATININE 0.82 08/26/2023   BILITOT 1.1 08/26/2023   ALKPHOS 63 06/25/2021   AST 15 08/26/2023   ALT 17 08/26/2023   PROT 7.6 08/26/2023   ALBUMIN 4.1 06/25/2021   CALCIUM 9.9 08/26/2023   GFRAA 137 08/10/2020    Speciality Comments: PLQ Eye Exam- [facility] 02/2021  Procedures:  No procedures performed Allergies: Patient has no known allergies.   Assessment / Plan:     Visit Diagnoses: Rheumatoid arthritis with rheumatoid factor of multiple sites without organ or systems involvement (HCC) - Consider increasing Methotrexate dose or switching to a different RA medication if sedimentation rate remains elevated and symptoms persist. - Plan: methotrexate (RHEUMATREX) 2.5 MG tablet, Sedimentation rate Patient reports discontinuation of Celebrex due to gastrointestinal upset and nausea, with some return of inflammation. Methotrexate is tolerated but causes some gastrointestinal upset on the day of administration. No visible swelling noted on examination. - Not interested in self administered injectable medications -Consider splitting Methotrexate dose to decrease gastrointestinal side effects, contine 20 mg PO weekly and folic acid 1 mg daily -Checking sed rate and CRP for disease activity monitoring  High risk medication use - Methotrexate 20 mg PO weekly and folic acid 1 mg daily. Add Celebrex 200 mg 1-2 times daily. - Plan: CBC with Differential/Platelet, COMPLETE METABOLIC PANEL WITH GFR -Checking CBC and CMP for medication monitoring on long term use of methotrexate and with NSAIDs  Physical stress - Patient's job involves lifting heavy dogs, which may be contributing to musculoskeletal pain.  Gastritis Symptoms are suggestive for gastritis secondary to NSAID use. Symptoms include nausea and difficulty digesting food with  regurgitation. -Discontinue regular use of Celebrex. -Consider use of a stomach acid suppressant when NSAID is needed.    Orders: Orders Placed This Encounter  Procedures   CBC with Differential/Platelet   COMPLETE METABOLIC PANEL WITH GFR   Sedimentation rate   Meds ordered this encounter  Medications   methotrexate (RHEUMATREX) 2.5 MG tablet    Sig: Take 8 tablets (20 mg total) by mouth once a week. Caution:Chemotherapy. Protect from light.    Dispense:  96 tablet    Refill:  0     Follow-Up Instructions: Return in about 3 months (around 03/16/2024) for Ra on MTX/NSAIDs f/u 3mos.   Fuller Plan, MD  Note - This record has been created using [employer].  Chart creation errors have been sought, but may not always  have been located. Such creation errors do not reflect on  the standard of medical care.

## 2023-12-10 ENCOUNTER — Other Ambulatory Visit: Payer: Self-pay | Admitting: *Deleted

## 2023-12-10 DIAGNOSIS — N913 Primary oligomenorrhea: Secondary | ICD-10-CM

## 2023-12-10 DIAGNOSIS — Z01419 Encounter for gynecological examination (general) (routine) without abnormal findings: Secondary | ICD-10-CM

## 2023-12-10 DIAGNOSIS — N922 Excessive menstruation at puberty: Secondary | ICD-10-CM

## 2023-12-10 DIAGNOSIS — Z3009 Encounter for other general counseling and advice on contraception: Secondary | ICD-10-CM

## 2023-12-10 MED ORDER — SLYND 4 MG PO TABS
1.0000 | ORAL_TABLET | Freq: Every day | ORAL | 1 refills | Status: DC
Start: 2023-12-10 — End: 2024-05-23

## 2023-12-10 NOTE — Progress Notes (Signed)
 RX Slynd reordered to [facility] for coverage by insurance.

## 2023-12-15 ENCOUNTER — Encounter: Payer: Self-pay | Admitting: Internal Medicine

## 2023-12-15 ENCOUNTER — Ambulatory Visit: Payer: Self-pay | Attending: Internal Medicine | Admitting: Internal Medicine

## 2023-12-15 VITALS — BP 125/86 | HR 82 | Resp 14 | Ht 67.0 in | Wt 229.0 lb

## 2023-12-15 DIAGNOSIS — Z733 Stress, not elsewhere classified: Secondary | ICD-10-CM | POA: Diagnosis not present

## 2023-12-15 DIAGNOSIS — M0579 Rheumatoid arthritis with rheumatoid factor of multiple sites without organ or systems involvement: Secondary | ICD-10-CM | POA: Diagnosis not present

## 2023-12-15 DIAGNOSIS — Z79899 Other long term (current) drug therapy: Secondary | ICD-10-CM

## 2023-12-15 MED ORDER — METHOTREXATE SODIUM 2.5 MG PO TABS: 20.0000 mg | ORAL_TABLET | ORAL | 0 refills | Status: DC

## 2023-12-15 NOTE — Patient Instructions (Signed)
 Consider splitting methotrexate dosing to 2 separate times within the same day to minimize GI side effects.  You can try using the celebrex or as an OTC options naproxen (aleve) just as needed instead of scheduled. You can try combining these with pepcid (famotidine) and see if that prevents the stomach irritation.

## 2023-12-16 LAB — COMPLETE METABOLIC PANEL WITH GFR
AG Ratio: 1.8 (calc) (ref 1.0–2.5)
ALT: 13 U/L (ref 6–29)
AST: 13 U/L (ref 10–30)
Albumin: 4.7 g/dL (ref 3.6–5.1)
Alkaline phosphatase (APISO): 65 U/L (ref 31–125)
BUN: 10 mg/dL (ref 7–25)
CO2: 26 mmol/L (ref 20–32)
Calcium: 9.7 mg/dL (ref 8.6–10.2)
Chloride: 106 mmol/L (ref 98–110)
Creat: 0.65 mg/dL (ref 0.50–0.96)
Globulin: 2.6 g/dL (ref 1.9–3.7)
Glucose, Bld: 83 mg/dL (ref 65–99)
Potassium: 4.3 mmol/L (ref 3.5–5.3)
Sodium: 141 mmol/L (ref 135–146)
Total Bilirubin: 1 mg/dL (ref 0.2–1.2)
Total Protein: 7.3 g/dL (ref 6.1–8.1)
eGFR: 124 mL/min/{1.73_m2} (ref >=60)

## 2023-12-16 LAB — CBC WITH DIFFERENTIAL/PLATELET
Absolute Lymphocytes: 1879 {cells}/uL (ref 850–3900)
Absolute Monocytes: 423 {cells}/uL (ref 200–950)
Basophils Absolute: 13 {cells}/uL (ref 0–200)
Basophils Relative: 0.2 %
Eosinophils Absolute: 52 {cells}/uL (ref 15–500)
Eosinophils Relative: 0.8 %
HCT: 44.2 % (ref 35.0–45.0)
Hemoglobin: 14.8 g/dL (ref 11.7–15.5)
MCH: 28.5 pg (ref 27.0–33.0)
MCHC: 33.5 g/dL (ref 32.0–36.0)
MCV: 85.2 fL (ref 80.0–100.0)
MPV: 10.6 fL (ref 7.5–12.5)
Monocytes Relative: 6.5 %
Neutro Abs: 4134 {cells}/uL (ref 1500–7800)
Neutrophils Relative %: 63.6 %
Platelets: 332 10*3/uL (ref 140–400)
RBC: 5.19 10*6/uL — ABNORMAL HIGH (ref 3.80–5.10)
RDW: 13 % (ref 11.0–15.0)
Total Lymphocyte: 28.9 %
WBC: 6.5 10*3/uL (ref 3.8–10.8)

## 2023-12-16 LAB — SEDIMENTATION RATE: Sed Rate: 17 mm/h (ref 0–20)

## 2023-12-17 NOTE — Progress Notes (Signed)
 Sed rate of 2 remains normal.  Blood count and kidney and liver function test are normal.

## 2023-12-26 ENCOUNTER — Ambulatory Visit
Admission: EM | Admit: 2023-12-26 | Discharge: 2023-12-26 | Disposition: A | Discharge status: Home / Self Care | Attending: Family Medicine | Admitting: Family Medicine

## 2023-12-26 DIAGNOSIS — B349 Viral infection, unspecified: Secondary | ICD-10-CM | POA: Diagnosis not present

## 2023-12-26 LAB — POCT INFLUENZA A/B
Influenza A, POC: NEGATIVE
Influenza B, POC: NEGATIVE

## 2023-12-26 LAB — POCT RAPID STREP A (OFFICE): Rapid Strep A Screen: NEGATIVE

## 2023-12-26 NOTE — Discharge Instructions (Signed)

## 2023-12-26 NOTE — ED Triage Notes (Signed)
 Patient presents to UC for cough, runny nose, oral lesions on hard palate noted yesterday. Treating with tylenol.

## 2023-12-26 NOTE — ED Provider Notes (Signed)
 [facility]    CSN: [id number] Arrival date & time: 12/26/23  1038      History   Chief Complaint Chief Complaint  Patient presents with   Mouth Lesions    HPI Maria Sutton is a 28 y.o. female  presents for evaluation of URI symptoms for 2 days. Patient reports associated symptoms of cough, runny nose, postnasal drip with itchy/sore throat and itchy ears.  States she noticed a red spot/ulceration to her hard palate last night.  Denies N/V/D, fevers, body aches, shortness of breath. Patient does not have a hx of asthma. Patient is not an active smoker.   Reports no sick contacts.  Pt has taken Tylenol OTC for symptoms. Pt has no other concerns at this time.    Mouth Lesions Associated symptoms: congestion and sore throat     Past Medical History:  Diagnosis Date   ADHD    Anxiety    Depression    Depression    Phreesia 08/07/2020   Headache    Lupus    PCOS (polycystic ovarian syndrome)    PTSD (post-traumatic stress disorder)    Rheumatoid arthritis (HCC)    Systemic lupus erythematosus (HCC)    Thyroid disease    hypothyroid    Patient Active Problem List   Diagnosis Date Noted   Low serum cortisol level 08/26/2023   Other fatigue 08/26/2023   Pain in left shoulder 09/24/2022   High risk medication use 09/24/2022   Synovitis of right shoulder 06/24/2022   Plantar fascial fibromatosis of both feet 06/24/2022   Effusion of metatarsophalangeal (MTP) joint of great toe 06/24/2022   Pain in right shoulder 09/10/2021   Rheumatoid arthritis with rheumatoid factor of multiple sites without organ or systems involvement (HCC) 08/28/2020   Positive ANA (antinuclear antibody) 08/22/2020   Vitamin D deficiency 03/19/2019   Obesity with body mass index (BMI) of 30.0 to 39.9 03/19/2019   Reactive depression 09/25/2018   Migraine without aura and without status migrainosus, not intractable 09/25/2018   Cognitive complaints 09/25/2018    No past surgical  history on file.  OB History     Gravida  0   Para  0   Term  0   Preterm  0   AB  0   Living  0      SAB  0   IAB  0   Ectopic  0   Multiple  0   Live Births  0            Home Medications    Prior to Admission medications   Medication Sig Start Date End Date Taking? Authorizing Provider  celecoxib (CELEBREX) 200 MG capsule Take 1 capsule (200 mg total) by mouth 2 (two) times daily as needed. 10/14/23   Rice, Jamesetta Orleans, MD  Drospirenone (SLYND) 4 MG TABS Take 1 tablet (4 mg total) by mouth daily. 12/10/23   Cooleen, Wilmer Floor, NP  folic acid (FOLVITE) 1 MG tablet Take 1 tablet (1 mg total) by mouth daily. Patient not taking: Reported on 10/27/2023 11/11/22   Fuller Plan, MD  methotrexate (RHEUMATREX) 2.5 MG tablet Take 8 tablets (20 mg total) by mouth once a week. Caution:Chemotherapy. Protect from light. 12/15/23   Fuller Plan, MD  Multiple Vitamins-Minerals (MULTIVITAMIN WITH MINERALS) tablet Take 1 tablet by mouth daily.    [provider]    Family History Family History  Problem Relation Age of Onset   Hypertension Mother  Breast cancer Other     Social History Social History   Tobacco Use   Smoking status: Never    Passive exposure: Never   Smokeless tobacco: Never  Vaping Use   Vaping status: Never Used  Substance Use Topics   Alcohol use: Yes    Comment: ocassional   Drug use: Never     Allergies   Patient has no known allergies.   Review of Systems Review of Systems  HENT:  Positive for congestion, mouth sores and sore throat.        Itchy ears  Respiratory:  Positive for cough.      Physical Exam Triage Vital Signs ED Triage Vitals  Encounter Vitals Group     BP 12/26/23 1131 (!) 135/95     Systolic BP Percentile --      Diastolic BP Percentile --      Pulse Rate 12/26/23 1131 73     Resp 12/26/23 1131 18     Temp 12/26/23 1131 98.8 F (37.1 C)     Temp Source 12/26/23 1131 Oral     SpO2  12/26/23 1131 96 %     Weight --      Height --      Head Circumference --      Peak Flow --      Pain Score 12/26/23 1129 3     Pain Loc --      Pain Education --      Exclude from Growth Chart --    No data found.  Updated Vital Signs BP (!) 135/95 (BP Location: Left Arm)   Pulse 73   Temp 98.8 F (37.1 C) (Oral)   Resp 18   LMP  (Approximate)   SpO2 96%   Visual Acuity Right Eye Distance:   Left Eye Distance:   Bilateral Distance:    Right Eye Near:   Left Eye Near:    Bilateral Near:     Physical Exam Vitals and nursing note reviewed.  Constitutional:      General: She is not in acute distress.    Appearance: She is well-developed. She is not ill-appearing.  HENT:     Head: Normocephalic and atraumatic.     Right Ear: Tympanic membrane and ear canal normal.     Left Ear: Tympanic membrane and ear canal normal.     Nose: Congestion present.     Mouth/Throat:     Mouth: Mucous membranes are moist.     Pharynx: Oropharynx is clear. Uvula midline. Posterior oropharyngeal erythema and postnasal drip present. No pharyngeal swelling, oropharyngeal exudate or uvula swelling.     Tonsils: No tonsillar exudate or tonsillar abscesses.      Comments: Small ulceration to hard palate adjacent to uvula.  No petechiae Eyes:     Conjunctiva/sclera: Conjunctivae normal.     Pupils: Pupils are equal, round, and reactive to light.  Cardiovascular:     Rate and Rhythm: Normal rate and regular rhythm.     Heart sounds: Normal heart sounds.  Pulmonary:     Effort: Pulmonary effort is normal.     Breath sounds: Normal breath sounds.  Musculoskeletal:     Cervical back: Normal range of motion and neck supple.  Lymphadenopathy:     Cervical: No cervical adenopathy.  Skin:    General: Skin is warm and dry.  Neurological:     General: No focal deficit present.     Mental Status: She is alert and oriented  to person, place, and time.  Psychiatric:        Mood and Affect: Mood  normal.        Behavior: Behavior normal.      UC Treatments / Results  Labs (all labs ordered are listed, but only abnormal results are displayed) Labs Reviewed  POCT RAPID STREP A (OFFICE)  POCT INFLUENZA A/B    EKG   Radiology No results found.  Procedures Procedures (including critical care time)  Medications Ordered in UC Medications - No data to display  Initial Impression / Assessment and Plan / UC Course  I have reviewed the triage vital signs and the nursing notes.  Pertinent labs & imaging results that were available during my care of the patient were reviewed by me and considered in my medical decision making (see chart for details).     Reviewed exam and symptoms with patient.  No red flags.  Negative rapid strep and flu.  Discussed viral illness and symptomatic treatment.  PCP follow-up if symptoms do not improve.  ER precautions reviewed and patient verbalized understanding. Final Clinical Impressions(s) / UC Diagnoses   Final diagnoses:  Viral illness     Discharge Instructions      Please treat your symptoms with over the counter cough medication, tylenol or ibuprofen, humidifier, and rest. Viral illnesses can last 7-14 days. Please follow up with your PCP if your symptoms are not improving. Please go to the ER for any worsening symptoms. This includes but is not limited to fever you can not control with tylenol or ibuprofen, you are not able to stay hydrated, you have shortness of breath or chest pain.  Thank you for choosing [facility] for your healthcare needs. I hope you feel better soon!      ED Prescriptions   None    PDMP not reviewed this encounter.   Radford Pax, NP 12/26/23 1225

## 2024-01-02 ENCOUNTER — Emergency Department (HOSPITAL_BASED_OUTPATIENT_CLINIC_OR_DEPARTMENT_OTHER)
Admission: EM | Admit: 2024-01-02 | Discharge: 2024-01-02 | Disposition: A | Discharge status: Home / Self Care | Attending: Emergency Medicine | Admitting: Emergency Medicine

## 2024-01-02 ENCOUNTER — Emergency Department (HOSPITAL_BASED_OUTPATIENT_CLINIC_OR_DEPARTMENT_OTHER)

## 2024-01-02 ENCOUNTER — Encounter (HOSPITAL_BASED_OUTPATIENT_CLINIC_OR_DEPARTMENT_OTHER): Payer: Self-pay | Admitting: *Deleted

## 2024-01-02 ENCOUNTER — Other Ambulatory Visit: Payer: Self-pay

## 2024-01-02 DIAGNOSIS — R1032 Left lower quadrant pain: Secondary | ICD-10-CM | POA: Insufficient documentation

## 2024-01-02 LAB — CBC
HCT: 44.7 % (ref 36.0–46.0)
Hemoglobin: 15.4 g/dL — ABNORMAL HIGH (ref 12.0–15.0)
MCH: 29.3 pg (ref 26.0–34.0)
MCHC: 34.5 g/dL (ref 30.0–36.0)
MCV: 85.1 fL (ref 80.0–100.0)
Platelets: 323 10*3/uL (ref 150–400)
RBC: 5.25 MIL/uL — ABNORMAL HIGH (ref 3.87–5.11)
RDW: 12.9 % (ref 11.5–15.5)
WBC: 8.1 10*3/uL (ref 4.0–10.5)
nRBC: 0 % (ref 0.0–0.2)

## 2024-01-02 LAB — COMPREHENSIVE METABOLIC PANEL WITH GFR
ALT: 39 U/L (ref 0–44)
AST: 21 U/L (ref 15–41)
Albumin: 4.4 g/dL (ref 3.5–5.0)
Alkaline Phosphatase: 65 U/L (ref 38–126)
Anion gap: 9 (ref 5–15)
BUN: 8 mg/dL (ref 6–20)
CO2: 25 mmol/L (ref 22–32)
Calcium: 9.4 mg/dL (ref 8.9–10.3)
Chloride: 103 mmol/L (ref 98–111)
Creatinine, Ser: 0.69 mg/dL (ref 0.44–1.00)
GFR, Estimated: >60 mL/min (ref >60)
Glucose, Bld: 83 mg/dL (ref 70–99)
Potassium: 3.9 mmol/L (ref 3.5–5.1)
Sodium: 137 mmol/L (ref 135–145)
Total Bilirubin: 1.6 mg/dL — ABNORMAL HIGH (ref 0.0–1.2)
Total Protein: 8.3 g/dL — ABNORMAL HIGH (ref 6.5–8.1)

## 2024-01-02 LAB — URINALYSIS, ROUTINE W REFLEX MICROSCOPIC
Bilirubin Urine: NEGATIVE
Glucose, UA: NEGATIVE mg/dL
Hgb urine dipstick: NEGATIVE
Ketones, ur: NEGATIVE mg/dL
Leukocytes,Ua: NEGATIVE
Nitrite: NEGATIVE
Protein, ur: NEGATIVE mg/dL
Specific Gravity, Urine: >=1.03 (ref 1.005–1.030)
pH: 7 (ref 5.0–8.0)

## 2024-01-02 LAB — LIPASE, BLOOD: Lipase: 23 U/L (ref 11–51)

## 2024-01-02 LAB — PREGNANCY, URINE: Preg Test, Ur: NEGATIVE

## 2024-01-02 NOTE — ED Notes (Signed)
 Pt. Is here for L lower abd. Pain with no nausea or vomiting.  Pt. Has been hurting since Tuesday of this week.

## 2024-01-02 NOTE — ED Provider Notes (Signed)
 [facility] Provider Note   CSN: [id number] Arrival date & time: 01/02/24  4098     History  Chief Complaint  Patient presents with   Abdominal Pain    Maria Sutton is a 28 y.o. female has medical history significant for PCOS, lupus, RA presents today for left lower quadrant abdominal pain since Tuesday.  Patient denies nausea, vomiting, constipation, diarrhea, fever, chills, urinary symptoms, vaginal discharge, chest pain, or shortness of breath.  Patient states that she takes birth control and has not had a menstrual cycle in 3 to 4 years.  Patient denies any previous abdominal surgeries.   Abdominal Pain      Home Medications Prior to Admission medications   Medication Sig Start Date End Date Taking? Authorizing Provider  celecoxib (CELEBREX) 200 MG capsule Take 1 capsule (200 mg total) by mouth 2 (two) times daily as needed. 10/14/23   Rice, Jamesetta Orleans, MD  Drospirenone (SLYND) 4 MG TABS Take 1 tablet (4 mg total) by mouth daily. 12/10/23   Cooleen, Wilmer Floor, NP  folic acid (FOLVITE) 1 MG tablet Take 1 tablet (1 mg total) by mouth daily. Patient not taking: Reported on 10/27/2023 11/11/22   Fuller Plan, MD  methotrexate (RHEUMATREX) 2.5 MG tablet Take 8 tablets (20 mg total) by mouth once a week. Caution:Chemotherapy. Protect from light. 12/15/23   Fuller Plan, MD  Multiple Vitamins-Minerals (MULTIVITAMIN WITH MINERALS) tablet Take 1 tablet by mouth daily.    [provider]      Allergies    Patient has no known allergies.    Review of Systems   Review of Systems  Gastrointestinal:  Positive for abdominal pain.    Physical Exam Updated Vital Signs BP (!) 141/93 (BP Location: Right Arm)   Pulse 75   Temp 98.6 F (37 C) (Oral)   Resp 20   SpO2 100%  Physical Exam Vitals and nursing note reviewed.  Constitutional:      General: She is not in acute distress.    Appearance: She is well-developed.  She is not ill-appearing, toxic-appearing or diaphoretic.  HENT:     Head: Normocephalic and atraumatic.     Mouth/Throat:     Mouth: Mucous membranes are moist.  Eyes:     Extraocular Movements: Extraocular movements intact.     Conjunctiva/sclera: Conjunctivae normal.  Cardiovascular:     Rate and Rhythm: Normal rate and regular rhythm.     Heart sounds: Normal heart sounds. No murmur heard. Pulmonary:     Effort: Pulmonary effort is normal. No respiratory distress.     Breath sounds: Normal breath sounds.  Abdominal:     General: Abdomen is protuberant. Bowel sounds are normal.     Palpations: Abdomen is soft.     Tenderness: There is abdominal tenderness in the suprapubic area and left lower quadrant. There is no right CVA tenderness or left CVA tenderness.  Musculoskeletal:        General: No swelling.     Cervical back: Neck supple.  Skin:    General: Skin is warm and dry.     Capillary Refill: Capillary refill takes less than 2 seconds.  Neurological:     General: No focal deficit present.     Mental Status: She is alert.  Psychiatric:        Mood and Affect: Mood normal.     ED Results / Procedures / Treatments   Labs (all labs ordered are  listed, but only abnormal results are displayed) Labs Reviewed  COMPREHENSIVE METABOLIC PANEL WITH GFR - Abnormal; Notable for the following components:      Result Value   Total Protein 8.3 (*)    Total Bilirubin 1.6 (*)    All other components within normal limits  CBC - Abnormal; Notable for the following components:   RBC 5.25 (*)    Hemoglobin 15.4 (*)    All other components within normal limits  LIPASE, BLOOD  URINALYSIS, ROUTINE W REFLEX MICROSCOPIC  PREGNANCY, URINE    EKG None  Radiology US PELVIC COMPLETE W TRANSVAGINAL AND TORSION R/O Result Date: 01/02/2024 CLINICAL DATA:  Left lower quadrant abdominal pain since Tuesday. EXAM: TRANSABDOMINAL AND TRANSVAGINAL ULTRASOUND OF PELVIS DOPPLER ULTRASOUND OF  OVARIES TECHNIQUE: Both transabdominal and transvaginal ultrasound examinations of the pelvis were performed. Transabdominal technique was performed for global imaging of the pelvis including uterus, ovaries, adnexal regions, and pelvic cul-de-sac. It was necessary to proceed with endovaginal exam following the transabdominal exam to visualize the uterus, endometrium, ovaries, and adnexa. Color and duplex Doppler ultrasound was utilized to evaluate blood flow to the ovaries. COMPARISON:  CT abdomen pelvis dated June 25, 2021. Pelvic ultrasound dated January 15, 2021. FINDINGS: Uterus Measurements: 5.1 x 2.1 x 3.3 cm = volume: 18.4 mL. No fibroids or other mass visualized. Endometrium Thickness: 2 mm.  No focal abnormality visualized. Right ovary Measurements: 3.5 x 2.2 x 2.2 cm = volume: 9.2 mL. Normal appearance/no adnexal mass. Left ovary Measurements: 4.1 x 1.6 x 1.7 cm = volume: 5.6 mL. Normal appearance/no adnexal mass. Pulsed Doppler evaluation of both ovaries demonstrates normal low-resistance arterial and venous waveforms. Other findings Small amount of free fluid in the pelvis, likely physiologic. IMPRESSION: 1. No acute abnormality. No evidence of ovarian torsion. Electronically Signed   By: Obie Dredge M.D.   On: 01/02/2024 13:16    Procedures Procedures    Medications Ordered in ED Medications - No data to display  ED Course/ Medical Decision Making/ A&P                                 Medical Decision Making Amount and/or Complexity of Data Reviewed Labs: ordered. Radiology: ordered.   This patient presents to the ED with chief complaint(s) of left lower quadrant abdominal pain with pertinent past medical history of migraine, RI, lupus, anxiety, PCOS which further complicates the presenting complaint. The complaint involves an extensive differential diagnosis and also carries with it a high risk of complications and morbidity.    The differential diagnosis includes  diverticulitis, ulcerative colitis,  Additional history obtained: Records reviewed rheumatology progress notes  ED Course and Reassessment:   Independent labs interpretation:  The following labs were independently interpreted:  CBC: Mildly elevated hemoglobin at 15.4 CMP: Mildly elevated protein and total bili Lipase: 23 UA: No notable findings Urine Preg: Negative  Independent visualization of imaging: - I independently visualized the following imaging with scope of interpretation limited to determining acute life threatening conditions related to emergency care: Ultrasound pelvic torsion rule out, which revealed no acute abnormality.  No evidence of ovarian torsion.  Consultation: - Consulted or discussed management/test interpretation w/ external professional: None  Consideration for admission or further workup: Considered for mission further workup however patient's vital signs, physical exam, labs and imaging were reassuring.  Given patient has some free fluid seen on ultrasound, it was possible that the patient previously  had a cyst which has now ruptured.  I feel patient is safe for discharge and follow-up with ultrasound.  Patient advised to take Tylenol/Motrin as needed for for pain.        Final Clinical Impression(s) / ED Diagnoses Final diagnoses:  Left lower quadrant abdominal pain    Rx / DC Orders ED Discharge Orders     None         Dolphus Jenny, PA-C 01/02/24 1343    Edwin Dada P, DO 01/02/24 1542

## 2024-01-02 NOTE — ED Triage Notes (Signed)
 Pt here for LLQ pain which began Tuesday.  No n/v with this.  Pt denies any urinary symptoms.  LBM was this am.

## 2024-01-02 NOTE — Discharge Instructions (Addendum)
 Today you were seen for left lower quadrant pain.  Please alternate taking Tylenol Motrin as needed for pain.  Please follow-up with OB/GYN for further evaluation and treatment.  Thank you for letting us treat you today. After reviewing your labs and imaging, I feel you are safe to go home. Please follow up with your PCP in the next several days and provide them with your records from this visit. Return to the Emergency Room if pain becomes severe or symptoms worsen.

## 2024-01-12 ENCOUNTER — Encounter: Payer: Self-pay | Admitting: Family Medicine

## 2024-01-12 ENCOUNTER — Ambulatory Visit (INDEPENDENT_AMBULATORY_CARE_PROVIDER_SITE_OTHER): Admitting: Family Medicine

## 2024-01-12 VITALS — BP 122/86 | HR 74 | Temp 97.0°F | Resp 16 | Ht 67.0 in | Wt 223.4 lb

## 2024-01-12 DIAGNOSIS — E7259 Other disorders of glycine metabolism: Secondary | ICD-10-CM

## 2024-01-12 DIAGNOSIS — R779 Abnormality of plasma protein, unspecified: Secondary | ICD-10-CM

## 2024-01-12 LAB — COMPREHENSIVE METABOLIC PANEL WITH GFR
ALT: 13 U/L (ref 0–35)
AST: 15 U/L (ref 0–37)
Albumin: 4.8 g/dL (ref 3.5–5.2)
Alkaline Phosphatase: 67 U/L (ref 39–117)
BUN: 9 mg/dL (ref 6–23)
CO2: 26 meq/L (ref 19–32)
Calcium: 9.4 mg/dL (ref 8.4–10.5)
Chloride: 105 meq/L (ref 96–112)
Creatinine, Ser: 0.73 mg/dL (ref 0.40–1.20)
GFR: 113.5 mL/min (ref >60.00)
Glucose, Bld: 80 mg/dL (ref 70–99)
Potassium: 3.7 meq/L (ref 3.5–5.1)
Sodium: 141 meq/L (ref 135–145)
Total Bilirubin: 1.1 mg/dL (ref 0.2–1.2)
Total Protein: 7.6 g/dL (ref 6.0–8.3)

## 2024-01-12 NOTE — Progress Notes (Signed)
 Back to normal.  Make sure Dr. Dimple Casey recks in June

## 2024-01-12 NOTE — Patient Instructions (Signed)

## 2024-01-12 NOTE — Progress Notes (Signed)
 Subjective:     Patient ID: Maria Sutton, female    DOB: 11-18-1995, 28 y.o.   MRN: [id number]  Chief Complaint  Patient presents with   ER Follow-up    Discuss results from recent ER visit for high protein and bilirubin     HPI Discussed the use of AI scribe software for clinical note transcription with the patient, who gave verbal consent to proceed.  History of Present Illness Maria Sutton is a 28 year old female with lupus and rheumatoid arthritis who presents with abdominal pain and elevated protein and bilirubin levels.  She has been experiencing severe abdominal pain localized to the lower left abdomen for approximately four days prior to her emergency room visit on January 02, 2024, rated at 7-8 out of 10. An ultrasound revealed a ruptured ovarian cyst with free fluid present. Blood work indicated elevated protein and bilirubin levels, which were normal in a previous test on December 15, 2023. No vomiting or diarrhea accompanied the pain.  She has a history of lupus and rheumatoid arthritis, managed by a rheumatologist. Her current medications include methotrexate, Celebrex, and a multivitamin containing folic acid. Celebrex causes significant digestive issues, including nausea and inability to digest food, leading to vomiting. Consequently, she has stopped taking Celebrex and manages pain with ibuprofen as needed.  She experienced a viral illness on December 26, 2023, with symptoms of mouth sores, initially suspected to be an upper respiratory infection. However, she discovered a tonsil stone, which she removed herself, leading to symptom resolution.  She acknowledges difficulty drinking water due to nausea and prefers beverages with electrolytes. She experiences migraines with purified water containing minerals. She has a history of high specific gravity in urine tests, indicating dehydration.  She reports a history of PTSD, with needles being a significant trigger. She experiences  shortness of breath, which she attributes to fatigue from lupus, but denies any new symptoms. She also reports a persistent spot under her breast, which has been present for some time.    There are no preventive care reminders to display for this patient.  Past Medical History:  Diagnosis Date   ADHD    Anxiety    Depression    Depression    Phreesia 08/07/2020   Headache    Lupus    PCOS (polycystic ovarian syndrome)    PTSD (post-traumatic stress disorder)    Rheumatoid arthritis (HCC)    Systemic lupus erythematosus (HCC)    Thyroid disease    hypothyroid    History reviewed. No pertinent surgical history.   Current Outpatient Medications:    celecoxib (CELEBREX) 200 MG capsule, Take 1 capsule (200 mg total) by mouth 2 (two) times daily as needed., Disp: 60 capsule, Rfl: 1   Drospirenone (SLYND) 4 MG TABS, Take 1 tablet (4 mg total) by mouth daily., Disp: 90 tablet, Rfl: 1   methotrexate (RHEUMATREX) 2.5 MG tablet, Take 8 tablets (20 mg total) by mouth once a week. Caution:Chemotherapy. Protect from light., Disp: 96 tablet, Rfl: 0   Multiple Vitamins-Minerals (MULTIVITAMIN WITH MINERALS) tablet, Take 1 tablet by mouth daily., Disp: , Rfl:   No Known Allergies ROS neg/noncontributory except as noted HPI/below      Objective:     BP 122/86   Pulse 74   Temp (!) 97 F (36.1 C) (Temporal)   Resp 16   Ht 5\' 7"  (1.702 m)   Wt 223 lb 6 oz (101.3 kg)   SpO2 97%   BMI 34.99 kg/m  Wt Readings from Last 3 Encounters:  01/12/24 223 lb 6 oz (101.3 kg)  12/15/23 229 lb (103.9 kg)  10/27/23 231 lb 4.8 oz (104.9 kg)    Physical Exam   Gen: WDWN NAD HEENT: NCAT, conjunctiva not injected, sclera nonicteric NECK:  supple, no thyromegaly, no nodes, no carotid bruits CARDIAC: RRR, S1S2+, no murmur. DP 2+B LUNGS: CTAB. No wheezes ABDOMEN:  BS+, soft, NTND, No HSM, no masses EXT:  no edema MSK: no gross abnormalities.  NEURO: A&O x3.  CN II-XII intact.  PSYCH: normal  mood. Good eye contact Under L latera breast, approx 1cm brown bordered patch.  No scale.   Reviewed ER records    Assessment & Plan:  Prolinemia [facility]) -     Comprehensive metabolic panel with GFR  Assessment and Plan Assessment & Plan Ruptured ovarian cyst   Severe left lower abdominal pain rated 7-8/10 for 3-4 days led to an ER visit on March 28, where a ruptured ovarian cyst was diagnosed. Pain resolved after two days of ibuprofen use.  Elevated protein and bilirubin levels   Recent blood work showed elevated protein and bilirubin levels, which were normal in previous tests on March 10. This elevation may be related to the recent ruptured ovarian cyst or a transient condition. Repeat blood work to assess protein and bilirubin levels. Communicate results and next steps based on findings.  Systemic lupus erythematosus (SLE) and rheumatoid arthritis   SLE and rheumatoid arthritis are managed with methotrexate and folic acid. Celebrex was discontinued due to gastrointestinal side effects. She experiences nausea with methotrexate and manages pain with ibuprofen or Aleve as needed. Continue methotrexate and folic acid. Use ibuprofen or Aleve as needed for pain management.  Gastrointestinal side effects from Celebrex   Significant gastrointestinal discomfort, including nausea and vomiting, occurred with Celebrex, leading to its discontinuation. She prefers alternative NSAIDs that do not exacerbate gastrointestinal symptoms. Avoid Celebrex. Use ibuprofen or Aleve as needed for pain management.  Dehydration   Signs of dehydration indicated by high specific gravity in urine tests. She reports difficulty drinking water due to nausea and prefers beverages with electrolytes. Increase water intake. Experiment with water with lemon or warm water to improve hydration. Avoid excessive electrolyte consumption.  General Health Maintenance   Regular follow-up appointments are advised to monitor overall  health, including cholesterol and thyroid levels. Establish a routine for annual physical exams. Schedule follow-up appointment in six months for annual physical.    Return in about 6 months (around 07/13/2024) for annual physical.  Angelena Sole, MD

## 2024-02-18 ENCOUNTER — Telehealth: Payer: Self-pay | Admitting: *Deleted

## 2024-02-18 NOTE — Telephone Encounter (Signed)
 Patient contacted the office asking about FMLA. Patient advised that she will need to discuss this with Dr. Rodell Citrin at her upcoming appointment on 03/08/2024. Patient advised he would nee to discussed whether he would be willing to complete the paperwork and he would need to have documentation in her chart.

## 2024-02-23 NOTE — Progress Notes (Deleted)
 Office Visit Note  Patient: Maria Sutton             Date of Birth: 03-Mar-1996           MRN: [id number]             PCP: Christel Cousins, MD Referring: Christel Cousins, MD Visit Date: 03/08/2024   Subjective:  No chief complaint on file.   History of Present Illness: Maria Sutton is a 28 y.o. 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    Previous HPI 12/15/2023 Maria Sutton is a 28 y.o. 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 We tried addition of celebrex  200 mg PRN for breakthrough joint pain especially in right arm and with use.   She experiences gastrointestinal issues, including upset stomach and nausea, which worsened significantly about two weeks ago while taking Celebrex . The symptoms became severe enough to prevent her from eating properly, as food would not digest. She discontinued Celebrex , leading to a slight return of inflammation symptoms, but noted less frequent stomach issues since stopping, although some nausea persists.   She continues to take methotrexate , which causes milder stomach upset, particularly on Sundays when she takes it. She has not tried methotrexate  injections due to discomfort with self-administration. She takes all doses at once, causing symptoms about an hour after ingestion, which improve throughout the day.   Regarding her rheumatoid arthritis, she reports no visible swelling but describes intermittent pain in her elbow, initially thought to be joint-related but later identified as localized pain. She experiences muscle weakness, particularly when lifting or running, where her muscles 'just give out.'   She has a history of IBS but has not experienced significant reflux or stomach issues prior to the recent events. Apples help alleviate some of her nausea. She describes her experience with Celebrex  as having 'good stomach  days or bad stomach days,' with more frequent bad days when on the medication. No burning sensation associated with acid reflux, and ginger ale helps with her symptoms. She has not been taking any medication for stomach acid, such as Pepcid or omeprazole, as she does not experience burning sensations typical of acid reflux.         Previous HPI 10/15/2023 Maria Sutton is a 28 y.o. 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 She has been experiencing increased symptoms since the beginning of December and again since around Christmas time. The pain has been persistent, varying in intensity but not completely subsiding. 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 They attempted to manage the pain with CBD, which had previously been effective for headaches and migraines, but found no relief. The pain was subtle during the day but intensified at night, often disrupting sleep. The pain would migrate between shoulders, the wrist, and a particular finger.   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 They speculated that the physical strain of the job might be contributing to the pain. Despite attempts to rest and reduce physical strain, the pain persisted.   The patient has been taking methotrexate  for their RA. Recent treatment with prednisone , taken at night to avoid daytime drowsiness, did not alleviate the pain much. They recently tried  Celebrex  starting last night, which allowed them to sleep but just one night so far.   The patient noticed visible swelling in the wrist and finger, which persisted for over a week. Prior to Christmas, they sustained an injury at work while handling a large dog. They fell and landed on their wrist, which subsequently began to hurt more. An x-ray at urgent care revealed no fractures, attributing the pain to  inflammation.   The patient has been applying for jobs that would allow them to work from home, as they believe their current job is exacerbating their RA symptoms. They are also concerned about the increasing physical demands and risks of their current job.      Previous HPI 08/25/23 Maria Sutton is a 28 y.o. 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 weekly folic acid  1 mg daily and as needed diclofenac  75 mg.  Joint pain has been reasonably well-controlled taking diclofenac  1 or 2 times per week.  Previous complaint is persistent fatigue.  She has experienced several episodes of nosebleeding.  This not painful and does not notice any sores or ulcers in the nose.  She is also been noticing some constitutional symptoms with chills at night going to bed also feeling more fatigued and short of breath quickly with exertion.   Previous HPI 05/26/2023 Maria Sutton is a 28 y.o. 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 weekly folic acid  1 mg daily and as needed diclofenac  75 mg.  She was sick with upper respiratory infection symptoms 1 time in late May after our last visit initially thought this was allergic rhinitis but then developed increased symptoms.  She was negative for viral or bacterial throat swab tests but ended up finishing a course of antibiotics and had cough that resolved about 3 weeks later.  Shoulder pain has been doing well with the diclofenac  as needed.  Pain in her toe is also doing well and not having much swelling.  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  Has a lot of stress in her work situation.   Previous HPI 02/10/2023 Maria Sutton is a 28 y.o. 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 weekly folic acid  1 mg daily and as needed diclofenac  75 mg.  Overall joint inflammation is improved compared to before still requires as needed  diclofenac  for shoulder pain but only provoked after more significant overexertion.  She is tolerating the medication okay but thinks she is having some mild side effects.  Experiencing photosensitivity with patchy or bumpy erythematous rashes on sun exposed areas this does better when using sunblock.  She has never noticed this type of rash with sun exposure previously.  Also has been sick with 3 rounds of what sounds like viral URI symptoms since our last visit most recent 1 resolved 3 to 4 weeks ago.   Previous HPI 11/11/22 Maria Sutton is a 28 y.o. female here for follow up for seropositive RA now on methotrexate  15 mg p.o. weekly and folic acid  1 mg daily and as needed diclofenac  75 mg p.o.  We started medication after telemedicine visit due to increased joint pain symptoms and new identified erosion at the left first MTP joint.  She is tolerating the methotrexate  without any noticeable side effect.  She did have 1 probable cold or other upper respiratory infection that took a week  to recover which is longer than usual otherwise no interval infections.  Symptoms are pretty well-controlled she is taking the diclofenac  once every 2 to 3 days at this point.    Previous HPI 03/28/22 Maria Sutton is a 28 y.o. female here for follow up for seropositive RA on HCQ 200 mg daily.  She is overall doing fairly well most frequent problem have been exacerbations of right shoulder pain.  She notices this most often after a event where she can recall some overuse or catch full use applying a lot of pressure in the area.  She is getting more improvement with turmeric than NSAIDs as needed.  Also had a right ring finger distal fracture without complication.  She saw ophthalmology last year in May but not yet this year.     Previous HPI 09/10/21 Maria Sutton is a 28 y.o. female here for follow up for seropositive RA on hydroxychloroquine  200 mg daily with new development of increased joint pain at several sites.  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  She was having only partial improvement with taking ibuprofen  for the increased joint pain she increased her hydroxychloroquine  to 2 tablets daily as well.  For the past week she is avoided any heavy overhead lifting and right shoulder pain has improved but is also developed increased symptoms in right wrist and left hand PIP joints.  These have not been associated with any significant swelling or erythema.   Previous HPI 04/23/21 Maria Sutton is a 28 y.o. female here for follow up for seropositive RA on hydroxychloroquine  200 mg PO daily.  Since her last visit she has not noticed any major flareup of joint pain stiffness or swelling.  She not had any more problems since lowering the hydroxychloroquine  to the 1 tablet daily dose.  She stopped taking the medicine for a few periods of time I think she noticed some increased joint pains particularly of the shoulders this improved again.  She was seen at the ED for viral upper respiratory infection a few times in April but no other significant infections.   10/25/2020 Maria Sutton is a 28 y.o. 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 She decreased her dose of medication from 400mg  to 200mg  due to intolerance and noticed problems such as sleep disturbance, jaw pain, eye twitching she was not sure which related to the medication or underlying issues. She feels that symptoms are partially improved with this, particularly TMJ pain also her right arm pain is improved since last visit and has not had a repeat episode of sharp chest pain. She does continue to have headaches and fatigue and right eyebrow twitching is worse.   DMARD Hx HCQ 08/2020-09/2022 Lack of efficacy MTX 09/2022-current    Labs 08/2020 ANA 1:320 speckled RF 42 CCP >250 SSA >8.0   08/2020 Vitamin D  26.7   No Rheumatology ROS completed.   PMFS History:  Patient Active Problem List   Diagnosis Date Noted   Low serum cortisol level 08/26/2023   Other fatigue 08/26/2023   Pain in left shoulder 09/24/2022   High risk medication use 09/24/2022   Synovitis of right shoulder 06/24/2022   Plantar fascial fibromatosis of both feet 06/24/2022   Effusion of metatarsophalangeal (MTP) joint of great toe 06/24/2022  Pain in right shoulder 09/10/2021   Rheumatoid arthritis with rheumatoid factor of multiple sites without organ or systems involvement (HCC) 08/28/2020   Positive ANA (antinuclear antibody) 08/22/2020   Vitamin D  deficiency 03/19/2019   Obesity with body mass index (BMI) of 30.0 to 39.9 03/19/2019   Reactive depression 09/25/2018   Migraine without aura and without status migrainosus, not intractable 09/25/2018   Cognitive complaints 09/25/2018    Past Medical History:  Diagnosis Date   ADHD    Anxiety    Depression    Depression    [employer] 08/07/2020   Headache    Lupus    PCOS (polycystic ovarian syndrome)    PTSD (post-traumatic stress disorder)    Rheumatoid arthritis (HCC)    Systemic lupus erythematosus (HCC)    Thyroid  disease    hypothyroid    Family History  Problem Relation Age of Onset   Hypertension Mother    Breast cancer Other    No past surgical history on file. Social History   Social History Narrative   Lives with mom and dad   Right handed   Drinks 1 cup caffeine daily      Deatra Face great aunt an others w/breast ca    There is no immunization history on file for this patient.   Objective: Vital Signs: There were no vitals taken for this visit.   Physical Exam   Musculoskeletal Exam: ***  CDAI Exam: CDAI Score: -- Patient Global: --; Provider Global: -- Swollen: --; Tender: -- Joint Exam 03/08/2024   No joint exam has been documented for this  visit   There is currently no information documented on the homunculus. Go to the Rheumatology activity and complete the homunculus joint exam.  Investigation: No additional findings.  Imaging: No results found.  Recent Labs: Lab Results  Component Value Date   WBC 8.1 01/02/2024   HGB 15.4 (H) 01/02/2024   PLT 323 01/02/2024   NA 141 01/12/2024   K 3.7 01/12/2024   CL 105 01/12/2024   CO2 26 01/12/2024   GLUCOSE 80 01/12/2024   BUN 9 01/12/2024   CREATININE 0.73 01/12/2024   BILITOT 1.1 01/12/2024   ALKPHOS 67 01/12/2024   AST 15 01/12/2024   ALT 13 01/12/2024   PROT 7.6 01/12/2024   ALBUMIN 4.8 01/12/2024   CALCIUM 9.4 01/12/2024   GFRAA 137 08/10/2020    Speciality Comments: PLQ Eye Exam- [facility] 02/2021  Procedures:  No procedures performed Allergies: Patient has no known allergies.   Assessment / Plan:     Visit Diagnoses: No diagnosis found.  ***  Orders: No orders of the defined types were placed in this encounter.  No orders of the defined types were placed in this encounter.    Follow-Up Instructions: No follow-ups on file.   Glena Landau, RT  Note - This record has been created using [employer].  Chart creation errors have been sought, but may not always  have been located. Such creation errors do not reflect on  the standard of medical care.

## 2024-03-08 ENCOUNTER — Ambulatory Visit: Admitting: Internal Medicine

## 2024-03-08 DIAGNOSIS — Z79899 Other long term (current) drug therapy: Secondary | ICD-10-CM

## 2024-03-08 DIAGNOSIS — Z733 Stress, not elsewhere classified: Secondary | ICD-10-CM

## 2024-03-08 DIAGNOSIS — M0579 Rheumatoid arthritis with rheumatoid factor of multiple sites without organ or systems involvement: Secondary | ICD-10-CM

## 2024-03-12 ENCOUNTER — Ambulatory Visit: Attending: Internal Medicine | Admitting: Internal Medicine

## 2024-03-12 ENCOUNTER — Encounter: Payer: Self-pay | Admitting: Internal Medicine

## 2024-03-12 VITALS — BP 122/78 | HR 79 | Ht 67.0 in | Wt 235.0 lb

## 2024-03-12 DIAGNOSIS — M0579 Rheumatoid arthritis with rheumatoid factor of multiple sites without organ or systems involvement: Secondary | ICD-10-CM | POA: Diagnosis not present

## 2024-03-12 DIAGNOSIS — Z79899 Other long term (current) drug therapy: Secondary | ICD-10-CM | POA: Diagnosis not present

## 2024-03-12 DIAGNOSIS — S63501D Unspecified sprain of right wrist, subsequent encounter: Secondary | ICD-10-CM | POA: Diagnosis not present

## 2024-03-12 DIAGNOSIS — M25512 Pain in left shoulder: Secondary | ICD-10-CM

## 2024-03-12 DIAGNOSIS — S63501A Unspecified sprain of right wrist, initial encounter: Secondary | ICD-10-CM | POA: Insufficient documentation

## 2024-03-12 DIAGNOSIS — R768 Other specified abnormal immunological findings in serum: Secondary | ICD-10-CM

## 2024-03-12 MED ORDER — FOLIC ACID 1 MG PO TABS: 1.0000 mg | ORAL_TABLET | Freq: Every day | ORAL | 3 refills | Status: DC

## 2024-03-12 MED ORDER — METHOTREXATE SODIUM 2.5 MG PO TABS
20.0000 mg | ORAL_TABLET | ORAL | 0 refills | Status: DC
Start: 2024-03-12 — End: 2024-06-21

## 2024-03-12 NOTE — Progress Notes (Signed)
 Office Visit Note  Patient: Maria Sutton             Date of Birth: 17-Feb-1996           MRN: [id number]             PCP: Christel Cousins, MD Referring: Christel Cousins, MD Visit Date: 03/12/2024   Subjective:  Follow-up (Doing good)   Discussed the use of AI scribe software for clinical note transcription with the patient, who gave verbal consent to proceed.  History of Present Illness   Maria Sutton is a 28 y.o. 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  She has been experiencing a flare-up in her left shoulder since yesterday, with pain intensifying at night and upon waking. Ibuprofen  is used for pain management, but the discomfort increases in the evening and during sleep. Morning stiffness, particularly in the spine, has been more intense over the past two weeks, and she stretches in the morning to alleviate it.  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 A splint is worn for the wrist injury, and she notes that her left shoulder pain may be exacerbated by increased use of her left arm due to the wrist injury.  She has a history of ruptured ovarian cysts, with the most recent episode occurring at the end of March, causing abdominal pain for four days before seeking emergency care. A ruptured cyst was diagnosed, and she has been on birth control for many years, which she believes has helped manage the cysts, with no further ruptures since.  Currently, she is taking methotrexate  for rheumatoid arthritis, which causes nausea, particularly on the day of administration. She has tried splitting the dose but finds it more manageable to take it all on Sunday to avoid nausea during work on Monday. She reports having some good days without nausea and some bad days where nausea persists throughout the day.  An elevated bilirubin level of 1.6 was noted in March,  which has since decreased. She has no history of gallbladder issues and attributes the elevation to the ruptured cyst at the time. No major illnesses or need for antibiotics have been reported recently.  She is in the process of applying for FMLA due to frequent absences from work related to her rheumatoid arthritis symptoms, which have been affecting her ability to work consistently. She reports feeling drained and experiencing symptoms like nausea and headaches, particularly towards the end of her work periods.       Previous HPI 12/15/2023 Maria Sutton is a 28 y.o. 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 We tried addition of celebrex  200 mg PRN for breakthrough joint pain especially in right arm and with use.   She experiences gastrointestinal issues, including upset stomach and nausea, which worsened significantly about two weeks ago while taking Celebrex . The symptoms became severe enough to prevent her from eating properly, as food would not digest. She discontinued Celebrex , leading to a slight return of inflammation symptoms, but noted less frequent stomach issues since stopping, although some nausea persists.   She continues to take methotrexate , which causes milder stomach upset, particularly on Sundays when she takes it. She has not tried methotrexate  injections due to discomfort with self-administration. She takes all doses at once, causing symptoms about  an hour after ingestion, which improve throughout the day.   Regarding her rheumatoid arthritis, she reports no visible swelling but describes intermittent pain in her elbow, initially thought to be joint-related but later identified as localized pain. She experiences muscle weakness, particularly when lifting or running, where her muscles 'just give out.'   She has a history of IBS but has not experienced significant reflux or stomach issues prior to the recent events.  Apples help alleviate some of her nausea. She describes her experience with Celebrex  as having 'good stomach days or bad stomach days,' with more frequent bad days when on the medication. No burning sensation associated with acid reflux, and ginger ale helps with her symptoms. She has not been taking any medication for stomach acid, such as Pepcid or omeprazole, as she does not experience burning sensations typical of acid reflux.         Previous HPI 10/15/2023 Maria Sutton is a 28 y.o. 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 She has been experiencing increased symptoms since the beginning of December and again since around Christmas time. The pain has been persistent, varying in intensity but not completely subsiding. 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 They attempted to manage the pain with CBD, which had previously been effective for headaches and migraines, but found no relief. The pain was subtle during the day but intensified at night, often disrupting sleep. The pain would migrate between shoulders, the wrist, and a particular finger.   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 They speculated that the physical strain of the job might be contributing to the pain. Despite attempts to rest and reduce physical strain, the pain persisted.   The patient has been taking methotrexate  for their RA. Recent treatment with prednisone , taken at night to avoid daytime drowsiness, did not alleviate the pain much. They recently tried Celebrex  starting last night, which allowed them to sleep but just one night so far.   The patient noticed visible swelling in the wrist and finger, which persisted for over a week. Prior to Christmas, they sustained an injury at work while handling a large dog. They fell and landed on their wrist, which  subsequently began to hurt more. An x-ray at urgent care revealed no fractures, attributing the pain to inflammation.   The patient has been applying for jobs that would allow them to work from home, as they believe their current job is exacerbating their RA symptoms. They are also concerned about the increasing physical demands and risks of their current job.      Previous HPI 08/25/23 Aundra Pung is a 28 y.o. 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 weekly folic acid  1 mg daily and as needed diclofenac  75 mg.  Joint pain has been reasonably well-controlled taking diclofenac  1 or 2 times per week.  Previous complaint is persistent fatigue.  She has experienced several episodes of nosebleeding.  This not painful and does not notice any sores or ulcers in the nose.  She is also been noticing some constitutional symptoms with chills at night going to bed also feeling more fatigued and short of breath quickly with exertion.   Previous HPI 05/26/2023 Dally Oshel is a 28 y.o. 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 weekly  folic acid  1 mg daily and as needed diclofenac  75 mg.  She was sick with upper respiratory infection symptoms 1 time in late May after our last visit initially thought this was allergic rhinitis but then developed increased symptoms.  She was negative for viral or bacterial throat swab tests but ended up finishing a course of antibiotics and had cough that resolved about 3 weeks later.  Shoulder pain has been doing well with the diclofenac  as needed.  Pain in her toe is also doing well and not having much swelling.  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  Has a lot of stress in her work situation.   Previous HPI 02/10/2023 Chesney Suares is a 28 y.o. 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 weekly folic acid  1 mg daily and as needed  diclofenac  75 mg.  Overall joint inflammation is improved compared to before still requires as needed diclofenac  for shoulder pain but only provoked after more significant overexertion.  She is tolerating the medication okay but thinks she is having some mild side effects.  Experiencing photosensitivity with patchy or bumpy erythematous rashes on sun exposed areas this does better when using sunblock.  She has never noticed this type of rash with sun exposure previously.  Also has been sick with 3 rounds of what sounds like viral URI symptoms since our last visit most recent 1 resolved 3 to 4 weeks ago.   Previous HPI 11/11/22 Sharryn Belding is a 28 y.o. female here for follow up for seropositive RA now on methotrexate  15 mg p.o. weekly and folic acid  1 mg daily and as needed diclofenac  75 mg p.o.  We started medication after telemedicine visit due to increased joint pain symptoms and new identified erosion at the left first MTP joint.  She is tolerating the methotrexate  without any noticeable side effect.  She did have 1 probable cold or other upper respiratory infection that took a week to recover which is longer than usual otherwise no interval infections.  Symptoms are pretty well-controlled she is taking the diclofenac  once every 2 to 3 days at this point.    Previous HPI 03/28/22 Trellis Guirguis is a 28 y.o. female here for follow up for seropositive RA on HCQ 200 mg daily.  She is overall doing fairly well most frequent problem have been exacerbations of right shoulder pain.  She notices this most often after a event where she can recall some overuse or catch full use applying a lot of pressure in the area.  She is getting more improvement with turmeric than NSAIDs as needed.  Also had a right ring finger distal fracture without complication.  She saw ophthalmology last year in May but not yet this year.     Previous HPI 09/10/21 Taysha Majewski is a 28 y.o. female here for follow up for seropositive RA  on hydroxychloroquine  200 mg daily with new development of increased joint pain at several sites.  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  She was having only partial improvement with taking ibuprofen  for the increased joint pain she increased her hydroxychloroquine  to 2 tablets daily as well.  For the past week she is avoided any heavy overhead lifting and right shoulder pain has improved but is  also developed increased symptoms in right wrist and left hand PIP joints.  These have not been associated with any significant swelling or erythema.   Previous HPI 04/23/21 Katti Pelle is a 28 y.o. female here for follow up for seropositive RA on hydroxychloroquine  200 mg PO daily.  Since her last visit she has not noticed any major flareup of joint pain stiffness or swelling.  She not had any more problems since lowering the hydroxychloroquine  to the 1 tablet daily dose.  She stopped taking the medicine for a few periods of time I think she noticed some increased joint pains particularly of the shoulders this improved again.  She was seen at the ED for viral upper respiratory infection a few times in April but no other significant infections.   10/25/2020 Jacqualine Weichel is a 28 y.o. 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 She decreased her dose of medication from 400mg  to 200mg  due to intolerance and noticed problems such as sleep disturbance, jaw pain, eye twitching she was not sure which related to the medication or underlying issues. She feels that symptoms are partially improved with this, particularly TMJ pain also her right arm pain is improved since last visit and has not had a repeat episode of sharp chest pain. She does continue to have headaches and fatigue and right  eyebrow twitching is worse.   DMARD Hx HCQ 08/2020-09/2022 Lack of efficacy MTX 09/2022-current   Labs 08/2020 ANA 1:320 speckled RF 42 CCP >250 SSA >8.0   08/2020 Vitamin D  26.7   Review of Systems  Constitutional:  Positive for fatigue.  HENT:  Negative for mouth sores and mouth dryness.   Eyes:  Positive for dryness.  Respiratory:  Positive for shortness of breath.   Cardiovascular:  Negative for chest pain and palpitations.  Gastrointestinal:  Negative for blood in stool, constipation and diarrhea.  Endocrine: Negative for increased urination.  Genitourinary:  Negative for involuntary urination.  Musculoskeletal:  Positive for joint pain, joint pain, joint swelling, myalgias, muscle weakness, morning stiffness, muscle tenderness and myalgias. Negative for gait problem.  Skin:  Positive for rash and hair loss. Negative for color change and sensitivity to sunlight.  Allergic/Immunologic: Negative for susceptible to infections.  Neurological:  Positive for dizziness. Negative for headaches.  Hematological:  Negative for swollen glands.  Psychiatric/Behavioral:  Positive for depressed mood and sleep disturbance. The patient is nervous/anxious.     PMFS History:  Patient Active Problem List   Diagnosis Date Noted   Right wrist sprain 03/12/2024   Low serum cortisol level 08/26/2023   Other fatigue 08/26/2023   Pain in left shoulder 09/24/2022   High risk medication use 09/24/2022   Synovitis of right shoulder 06/24/2022   Plantar fascial fibromatosis of both feet 06/24/2022   Effusion of metatarsophalangeal (MTP) joint of great toe 06/24/2022   Pain in right shoulder 09/10/2021   Rheumatoid arthritis with rheumatoid factor of multiple sites without organ or systems involvement (HCC) 08/28/2020   Positive ANA (antinuclear antibody) 08/22/2020   Vitamin D  deficiency 03/19/2019   Obesity with body mass index (BMI) of 30.0 to 39.9 03/19/2019   Reactive depression  09/25/2018   Migraine without aura and without status migrainosus, not intractable 09/25/2018   Cognitive complaints 09/25/2018    Past Medical History:  Diagnosis Date   ADHD    Anxiety    Depression    Depression    Phreesia 08/07/2020  Headache    Lupus    PCOS (polycystic ovarian syndrome)    PTSD (post-traumatic stress disorder)    Rheumatoid arthritis (HCC)    Systemic lupus erythematosus (HCC)    Thyroid  disease    hypothyroid    Family History  Problem Relation Age of Onset   Hypertension Mother    Breast cancer Other    History reviewed. No pertinent surgical history. Social History   Social History Narrative   Lives with mom and dad   Right handed   Drinks 1 cup caffeine daily      Deatra Face great aunt an others w/breast ca    There is no immunization history on file for this patient.   Objective: Vital Signs: BP 122/78 (BP Location: Left Arm, Patient Position: Sitting, Cuff Size: Normal)   Pulse 79   Ht 5' 7 (1.702 m)   Wt 235 lb (106.6 kg)   BMI 36.81 kg/m    Physical Exam  Eyes:     Conjunctiva/sclera: Conjunctivae normal.    Cardiovascular:     Rate and Rhythm: Normal rate and regular rhythm.  Pulmonary:     Effort: Pulmonary effort is normal.     Breath sounds: Normal breath sounds.   Skin:    General: Skin is warm and dry.     Findings: No rash.   Neurological:     Mental Status: She is alert.   Psychiatric:        Mood and Affect: Mood normal.      Musculoskeletal Exam:  Shoulders full ROM, left shoulder pain with overhead abduction, no palpable swelling no radiation Elbows full ROM no tenderness or swelling Wrists full ROM no tenderness or swelling Fingers full ROM no tenderness or swelling Knees full ROM no tenderness or swelling   Investigation: No additional findings.  Imaging: No results found.  Recent Labs: Lab Results  Component Value Date   WBC 7.3 03/12/2024   HGB 14.7 03/12/2024   PLT 310 03/12/2024   NA  139 03/12/2024   K 4.0 03/12/2024   CL 106 03/12/2024   CO2 25 03/12/2024   GLUCOSE 79 03/12/2024   BUN 6 (L) 03/12/2024   CREATININE 0.64 03/12/2024   BILITOT 0.8 03/12/2024   ALKPHOS 67 01/12/2024   AST 11 03/12/2024   ALT 12 03/12/2024   PROT 7.0 03/12/2024   ALBUMIN 4.8 01/12/2024   CALCIUM 9.3 03/12/2024   GFRAA 137 08/10/2020    Speciality Comments: PLQ Eye Exam- [facility] 02/2021  Procedures:  No procedures performed Allergies: Patient has no known allergies.   Assessment / Plan:     Visit Diagnoses: Rheumatoid arthritis with rheumatoid factor of multiple sites without organ or systems involvement (HCC) - Plan: Sedimentation rate, methotrexate  (RHEUMATREX) 2.5 MG tablet, folic acid  (FOLVITE ) 1 MG tablet Chronic rheumatoid arthritis with increased morning stiffness, possibly exacerbated by physical activity. Prefers NSAIDs over Celebrex  due to gastrointestinal side effects. Methotrexate  causes nausea, managed by taking it on Sundays. Discussed methotrexate  injections, prefers oral medication. Consider increasing Methotrexate  dose or switching to a different RA medication if sedimentation rate remains elevated and symptoms persist. - Continue methotrexate  20 mg PO weekly and folic acid  1 mg daily - Continue ibuprofen  as needed for pain and stiffness. - Checking sed rate for disease activity monitoring - Plan to fill FMLA paperwork for intermittently as she is having some missed work regularly on account of scheduled clinic follow-up for monitoring and several episodes of symptom exacerbation  High  risk medication use - Plan: CBC with Differential/Platelet, Comprehensive metabolic panel with GFR Persistent nausea associated with methotrexate  use, prefers oral medication despite gastrointestinal side effects.  No serious interval infections - Checking CBC and CMP for medication monitoring and long-term continued use of methotrexate   Sprain of right wrist, subsequent  encounter  Left shoulder pain Acute left shoulder pain due to compensatory overuse following wrist sprain. Pain exacerbated by overhead movements, worsens at night, improves with stretching. - Continue ibuprofen  as needed for shoulder pain. - Encourage stretching exercises to alleviate morning stiffness.  Left wrist sprain Recent wrist sprain, immobilized with splint, advised minimal movement for healing. Full recovery expected in four to six weeks. - Continue wearing wrist splint until follow-up appointment on June 11. - Minimize wrist movement for two weeks to promote healing.  Ruptured ovarian cyst Ruptured ovarian cyst in March, managed with ER visit. History of multiple cysts, no recent rupture. Current birth control maintained. - Consult gynecologist if another cyst rupture occurs.    Orders: Orders Placed This Encounter  Procedures   Sedimentation rate   CBC with Differential/Platelet   Comprehensive metabolic panel with GFR   Meds ordered this encounter  Medications   methotrexate  (RHEUMATREX) 2.5 MG tablet    Sig: Take 8 tablets (20 mg total) by mouth once a week. Caution:Chemotherapy. Protect from light.    Dispense:  96 tablet    Refill:  0   folic acid  (FOLVITE ) 1 MG tablet    Sig: Take 1 tablet (1 mg total) by mouth daily.    Dispense:  90 tablet    Refill:  3     Follow-Up Instructions: Return in about 3 months (around 06/12/2024) for RA/SLE on MTX f/u 3mos.   Matt Song, MD  Note - This record has been created using [employer].  Chart creation errors have been sought, but may not always  have been located. Such creation errors do not reflect on  the standard of medical care.

## 2024-03-13 LAB — COMPREHENSIVE METABOLIC PANEL WITH GFR
AG Ratio: 1.9 (calc) (ref 1.0–2.5)
ALT: 12 U/L (ref 6–29)
AST: 11 U/L (ref 10–30)
Albumin: 4.6 g/dL (ref 3.6–5.1)
Alkaline phosphatase (APISO): 63 U/L (ref 31–125)
BUN/Creatinine Ratio: 9 (calc) (ref 6–22)
BUN: 6 mg/dL — ABNORMAL LOW (ref 7–25)
CO2: 25 mmol/L (ref 20–32)
Calcium: 9.3 mg/dL (ref 8.6–10.2)
Chloride: 106 mmol/L (ref 98–110)
Creat: 0.64 mg/dL (ref 0.50–0.96)
Globulin: 2.4 g/dL (ref 1.9–3.7)
Glucose, Bld: 79 mg/dL (ref 65–99)
Potassium: 4 mmol/L (ref 3.5–5.3)
Sodium: 139 mmol/L (ref 135–146)
Total Bilirubin: 0.8 mg/dL (ref 0.2–1.2)
Total Protein: 7 g/dL (ref 6.1–8.1)
eGFR: 125 mL/min/{1.73_m2} (ref >=60)

## 2024-03-13 LAB — CBC WITH DIFFERENTIAL/PLATELET
Absolute Lymphocytes: 2307 {cells}/uL (ref 850–3900)
Absolute Monocytes: 365 {cells}/uL (ref 200–950)
Basophils Absolute: 7 {cells}/uL (ref 0–200)
Basophils Relative: 0.1 %
Eosinophils Absolute: 73 {cells}/uL (ref 15–500)
Eosinophils Relative: 1 %
HCT: 43.9 % (ref 35.0–45.0)
Hemoglobin: 14.7 g/dL (ref 11.7–15.5)
MCH: 29.2 pg (ref 27.0–33.0)
MCHC: 33.5 g/dL (ref 32.0–36.0)
MCV: 87.1 fL (ref 80.0–100.0)
MPV: 10.4 fL (ref 7.5–12.5)
Monocytes Relative: 5 %
Neutro Abs: 4548 {cells}/uL (ref 1500–7800)
Neutrophils Relative %: 62.3 %
Platelets: 310 10*3/uL (ref 140–400)
RBC: 5.04 10*6/uL (ref 3.80–5.10)
RDW: 12.9 % (ref 11.0–15.0)
Total Lymphocyte: 31.6 %
WBC: 7.3 10*3/uL (ref 3.8–10.8)

## 2024-03-13 LAB — SEDIMENTATION RATE: Sed Rate: 9 mm/h (ref 0–20)

## 2024-03-15 ENCOUNTER — Telehealth: Payer: Self-pay

## 2024-03-15 NOTE — Telephone Encounter (Signed)
 Patient call the office to see if her FMLA paperwork was received in our office for Dr. Rodell Citrin, advised that we have not received it yet, but we will contact her when received and it has been completed.

## 2024-03-22 NOTE — Telephone Encounter (Signed)
 Patient contacted the office again to see if the FMLA paperwork was completed yet, advised per Dr. Rodell Citrin it is not yet been completed

## 2024-03-29 NOTE — Telephone Encounter (Signed)
 Patient contacted the office to follow up on her FMLA paperwork. Patient states it is due on 04/02/2024 and she is receiving message from her HR that they have not received it yet.

## 2024-03-29 NOTE — Telephone Encounter (Signed)
 Paperwork filled out. Patient will come to the office tomorrow to sign her portion and then we will fax paperwork and make a copy for scan place.

## 2024-03-30 ENCOUNTER — Ambulatory Visit: Payer: Self-pay | Admitting: Internal Medicine

## 2024-03-30 ENCOUNTER — Ambulatory Visit
Admission: EM | Admit: 2024-03-30 | Discharge: 2024-03-30 | Disposition: A | Discharge status: Home / Self Care | Attending: Family Medicine | Admitting: Family Medicine

## 2024-03-30 ENCOUNTER — Other Ambulatory Visit: Payer: Self-pay

## 2024-03-30 ENCOUNTER — Telehealth: Payer: Self-pay | Admitting: Internal Medicine

## 2024-03-30 DIAGNOSIS — J029 Acute pharyngitis, unspecified: Secondary | ICD-10-CM | POA: Diagnosis present

## 2024-03-30 LAB — POCT RAPID STREP A (OFFICE): Rapid Strep A Screen: NEGATIVE

## 2024-03-30 LAB — POC SARS CORONAVIRUS 2 AG -  ED: SARS Coronavirus 2 Ag: NEGATIVE

## 2024-03-30 NOTE — Progress Notes (Signed)
 Sedimentation rate look good at 9 which is normal.  Blood count and kidney and liver function test are normal no problem for continuing methotrexate .

## 2024-03-30 NOTE — Telephone Encounter (Signed)
 Patient stopped by the office to sign FMLA paperwork and stated she has thrush on both tonsils.  Patient plans to go to [facility] and wanted to know if there was any antifungal medication that will interfere with her Methotrexate .

## 2024-03-30 NOTE — Telephone Encounter (Signed)
 Patient advised her paperwork has been faxed.

## 2024-03-30 NOTE — ED Triage Notes (Addendum)
 Pt c/o tonsils have white coating on them and dysphagiax2d. PT states not on atx, but on methotrexate .

## 2024-03-30 NOTE — Telephone Encounter (Signed)
 I called the patient recommended typical antifungals for thrush including nystatin or fluconazole would not be problems for methotrexate .

## 2024-03-30 NOTE — Telephone Encounter (Signed)
 Patient contacted the office stating that she went to Urgent care an they told her it was not thrush, they also tested her for strep which also came back negative. Patient stats she is not sure what it is but she has been to the Dr several times for this white coating on her tongue several times. Per Dr Jeannetta the patient could take an antifungal but if the Urgent care didn't diagnose it as that then she may need to see her PCP since the methotrexate  would not cause the the white coating on her tongue.

## 2024-03-30 NOTE — ED Provider Notes (Signed)
 [facility]    CSN: [id number] Arrival date & time: 03/30/24  1143      History   Chief Complaint No chief complaint on file.   HPI Maria Sutton is a 28 y.o. female  presents for evaluation of URI symptoms for 2 days. Patient reports associated symptoms of sore throat and fatigue. Denies N/V/D, cough, congestion, ear pain, body aches, shortness of breath. Patient does not have a hx of asthma. Patient does not an active smoker.   Reports no sick contacts.  Pt has taken nothing OTC for symptoms.  Patient is on methotrexate  for rheumatoid arthritis.  She is concerned that she has thrush as she thought she saw a white film.  States she does have a history of tonsil stones.  Pt has no other concerns at this time.   HPI  Past Medical History:  Diagnosis Date   ADHD    Anxiety    Depression    Depression    [employer] 08/07/2020   Headache    Lupus    PCOS (polycystic ovarian syndrome)    PTSD (post-traumatic stress disorder)    Rheumatoid arthritis (HCC)    Systemic lupus erythematosus (HCC)    Thyroid  disease    hypothyroid    Patient Active Problem List   Diagnosis Date Noted   Right wrist sprain 03/12/2024   Low serum cortisol level 08/26/2023   Other fatigue 08/26/2023   Pain in left shoulder 09/24/2022   High risk medication use 09/24/2022   Synovitis of right shoulder 06/24/2022   Plantar fascial fibromatosis of both feet 06/24/2022   Effusion of metatarsophalangeal (MTP) joint of great toe 06/24/2022   Pain in right shoulder 09/10/2021   Rheumatoid arthritis with rheumatoid factor of multiple sites without organ or systems involvement (HCC) 08/28/2020   Positive ANA (antinuclear antibody) 08/22/2020   Vitamin D  deficiency 03/19/2019   Obesity with body mass index (BMI) of 30.0 to 39.9 03/19/2019   Reactive depression 09/25/2018   Migraine without aura and without status migrainosus, not intractable 09/25/2018   Cognitive complaints 09/25/2018     History reviewed. No pertinent surgical history.  OB History     Gravida  0   Para  0   Term  0   Preterm  0   AB  0   Living  0      SAB  0   IAB  0   Ectopic  0   Multiple  0   Live Births  0            Home Medications    Prior to Admission medications   Medication Sig Start Date End Date Taking? Authorizing Provider  celecoxib  (CELEBREX ) 200 MG capsule Take 1 capsule (200 mg total) by mouth 2 (two) times daily as needed. Patient not taking: Reported on 03/12/2024 10/14/23   Jeannetta Lonni ORN, MD  Drospirenone  (SLYND ) 4 MG TABS Take 1 tablet (4 mg total) by mouth daily. 12/10/23   Cooleen, Olam LABOR, NP  folic acid  (FOLVITE ) 1 MG tablet Take 1 tablet (1 mg total) by mouth daily. 03/12/24   Rice, Lonni ORN, MD  methotrexate  (RHEUMATREX) 2.5 MG tablet Take 8 tablets (20 mg total) by mouth once a week. Caution:Chemotherapy. Protect from light. 03/12/24   Jeannetta Lonni ORN, MD  Multiple Vitamins-Minerals (MULTIVITAMIN WITH MINERALS) tablet Take 1 tablet by mouth daily.    [provider]    Family History Family History  Problem Relation Age of Onset  Hypertension Mother    Breast cancer Other     Social History Social History   Tobacco Use   Smoking status: Never    Passive exposure: Never   Smokeless tobacco: Never  Vaping Use   Vaping status: Never Used  Substance Use Topics   Alcohol use: Yes    Comment: ocassional   Drug use: Never     Allergies   Patient has no known allergies.   Review of Systems Review of Systems  HENT:  Positive for sore throat.      Physical Exam Triage Vital Signs ED Triage Vitals  Encounter Vitals Group     BP 03/30/24 1247 124/87     Girls Systolic BP Percentile --      Girls Diastolic BP Percentile --      Boys Systolic BP Percentile --      Boys Diastolic BP Percentile --      Pulse Rate 03/30/24 1247 83     Resp 03/30/24 1247 16     Temp 03/30/24 1247 98.4 F (36.9 C)     Temp Source  03/30/24 1247 Oral     SpO2 03/30/24 1247 96 %     Weight --      Height --      Head Circumference --      Peak Flow --      Pain Score 03/30/24 1245 3     Pain Loc --      Pain Education --      Exclude from Growth Chart --    No data found.  Updated Vital Signs BP 124/87   Pulse 83   Temp 98.4 F (36.9 C) (Oral)   Resp 16   SpO2 96%   Visual Acuity Right Eye Distance:   Left Eye Distance:   Bilateral Distance:    Right Eye Near:   Left Eye Near:    Bilateral Near:     Physical Exam Vitals and nursing note reviewed.  Constitutional:      General: She is not in acute distress.    Appearance: She is well-developed. She is not ill-appearing.  HENT:     Head: Normocephalic and atraumatic.     Right Ear: Tympanic membrane and ear canal normal.     Left Ear: Tympanic membrane and ear canal normal.     Nose: No congestion.     Mouth/Throat:     Mouth: Mucous membranes are moist.     Pharynx: Oropharynx is clear. Uvula midline. Posterior oropharyngeal erythema present. No pharyngeal swelling, oropharyngeal exudate, uvula swelling or postnasal drip.     Tonsils: No tonsillar exudate or tonsillar abscesses. 1+ on the right. 1+ on the left.     Comments: There is no film or coating to the tongue, mouth or throat/tonsils.  Tonsil stones noted bilaterally  Eyes:     Conjunctiva/sclera: Conjunctivae normal.     Pupils: Pupils are equal, round, and reactive to light.    Cardiovascular:     Rate and Rhythm: Normal rate and regular rhythm.     Heart sounds: Normal heart sounds.  Pulmonary:     Effort: Pulmonary effort is normal.     Breath sounds: Normal breath sounds. No wheezing or rhonchi.   Musculoskeletal:     Cervical back: Normal range of motion and neck supple.  Lymphadenopathy:     Cervical: No cervical adenopathy.   Skin:    General: Skin is warm and dry.   Neurological:  General: No focal deficit present.     Mental Status: She is alert and oriented  to person, place, and time.   Psychiatric:        Mood and Affect: Mood normal.        Behavior: Behavior normal.      UC Treatments / Results  Labs (all labs ordered are listed, but only abnormal results are displayed) Labs Reviewed  CULTURE, GROUP A STREP [facility])  POCT RAPID STREP A (OFFICE)  POC SARS CORONAVIRUS 2 AG -  ED    EKG   Radiology No results found.  Procedures Procedures (including critical care time)  Medications Ordered in UC Medications - No data to display  Initial Impression / Assessment and Plan / UC Course  I have reviewed the triage vital signs and the nursing notes.  Pertinent labs & imaging results that were available during my care of the patient were reviewed by me and considered in my medical decision making (see chart for details).     Reviewed exam and symptoms with patient.  No red flags.  Negative COVID and rapid strep, will send strep throat culture.  Discussed with patient no signs of thrush on exam.  Did discuss tonsil stones.  Reviewed viral illness and symptomatic treatment.  PCP follow-up if symptoms do not improve.  ER precautions reviewed. Final Clinical Impressions(s) / UC Diagnoses   Final diagnoses:  Sore throat  Viral pharyngitis     Discharge Instructions      The clinic will contact you with results of the strep throat culture swab done today if positive.  You may continue salt gargles and warm liquids such as teas and honey.  May also take over-the-counter ibuprofen  or Tylenol as needed.  Lots of rest and fluids.  Please follow-up with your PCP if your symptoms do not improve.  Please go to the ER for any worsening symptoms.  Hope you feel better soon!     ED Prescriptions   None    PDMP not reviewed this encounter.   Loreda Myla SAUNDERS, NP 03/30/24 1322

## 2024-03-30 NOTE — Discharge Instructions (Signed)
 The clinic will contact you with results of the strep throat culture swab done today if positive.  You may continue salt gargles and warm liquids such as teas and honey.  May also take over-the-counter ibuprofen  or Tylenol as needed.  Lots of rest and fluids.  Please follow-up with your PCP if your symptoms do not improve.  Please go to the ER for any worsening symptoms.  Hope you feel better soon!

## 2024-03-31 LAB — CULTURE, GROUP A STREP (THRC)

## 2024-04-02 ENCOUNTER — Ambulatory Visit (HOSPITAL_COMMUNITY): Payer: Self-pay

## 2024-05-19 ENCOUNTER — Other Ambulatory Visit: Payer: Self-pay | Admitting: Nurse Practitioner

## 2024-05-19 DIAGNOSIS — N922 Excessive menstruation at puberty: Secondary | ICD-10-CM

## 2024-05-19 DIAGNOSIS — Z3009 Encounter for other general counseling and advice on contraception: Secondary | ICD-10-CM

## 2024-05-19 DIAGNOSIS — Z01419 Encounter for gynecological examination (general) (routine) without abnormal findings: Secondary | ICD-10-CM

## 2024-05-19 DIAGNOSIS — N913 Primary oligomenorrhea: Secondary | ICD-10-CM

## 2024-06-08 NOTE — Progress Notes (Signed)
 Office Visit Note  Patient: Maria Sutton             Date of Birth: [birth year]           MRN: [id number]             PCP: Wendolyn Jenkins Jansky, MD Referring: Wendolyn Jenkins Jansky, MD Visit Date: 06/21/2024   Subjective:    Discussed the use of AI scribe software for clinical note transcription with the patient, who gave verbal consent to proceed.  History of Present Illness   Maria Sutton is a 28 y.o. 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   She continues on methotrexate  20 mg orally once weekly and folic acid  1 mg daily. She uses ibuprofen  occasionally, about once or twice a month, but recently increased to twice daily for three days due to a painful cat bite on her hand. The inflammation from the bite resolved with ibuprofen  use.  She experienced an upper respiratory infection since her last visit, characterized by white phlegm on her tonsils. A throat culture was performed, but she did not receive the results. She managed symptoms with numbing spray and self-care, and the symptoms eventually resolved.  She reports increased fatigue and sleep disturbances, noting that she wakes up at least once during the night and feels fatigued throughout the day. She also experiences occasional dizziness and has noticed memory issues, such as forgetting conversations mid-sentence. Her roommate has observed these memory lapses as well.   Previous HPI 03/12/2024 Maria Sutton is a 28 y.o. 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   She has been experiencing a flare-up in her left shoulder since yesterday, with pain intensifying at night and upon waking. Ibuprofen  is used for pain management, but the discomfort increases in the evening and during sleep. Morning stiffness, particularly in the spine, has been more intense over the past two weeks, and she stretches in the morning to  alleviate it.   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 A splint is worn for the wrist injury, and she notes that her left shoulder pain may be exacerbated by increased use of her left arm due to the wrist injury.   She has a history of ruptured ovarian cysts, with the most recent episode occurring at the end of March, causing abdominal pain for four days before seeking emergency care. A ruptured cyst was diagnosed, and she has been on birth control for many years, which she believes has helped manage the cysts, with no further ruptures since.   Currently, she is taking methotrexate  for rheumatoid arthritis, which causes nausea, particularly on the day of administration. She has tried splitting the dose but finds it more manageable to take it all on Sunday to avoid nausea during work on Monday. She reports having some good days without nausea and some bad days where nausea persists throughout the day.   An elevated bilirubin level of 1.6 was noted in March, which has since decreased. She has no history of gallbladder issues and attributes the elevation to the ruptured cyst at the time. No major illnesses or need for antibiotics have been reported recently.   She is in the process of applying for FMLA due to frequent absences from work related to her rheumatoid arthritis symptoms, which  have been affecting her ability to work consistently. She reports feeling drained and experiencing symptoms like nausea and headaches, particularly towards the end of her work periods.         Previous HPI 12/15/2023 Maria Sutton is a 28 y.o. 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 We tried addition of celebrex  200 mg PRN for breakthrough joint pain especially in right arm and with use.   She experiences gastrointestinal issues, including upset stomach and nausea, which worsened  significantly about two weeks ago while taking Celebrex . The symptoms became severe enough to prevent her from eating properly, as food would not digest. She discontinued Celebrex , leading to a slight return of inflammation symptoms, but noted less frequent stomach issues since stopping, although some nausea persists.   She continues to take methotrexate , which causes milder stomach upset, particularly on Sundays when she takes it. She has not tried methotrexate  injections due to discomfort with self-administration. She takes all doses at once, causing symptoms about an hour after ingestion, which improve throughout the day.   Regarding her rheumatoid arthritis, she reports no visible swelling but describes intermittent pain in her elbow, initially thought to be joint-related but later identified as localized pain. She experiences muscle weakness, particularly when lifting or running, where her muscles 'just give out.'   She has a history of IBS but has not experienced significant reflux or stomach issues prior to the recent events. Apples help alleviate some of her nausea. She describes her experience with Celebrex  as having 'good stomach days or bad stomach days,' with more frequent bad days when on the medication. No burning sensation associated with acid reflux, and ginger ale helps with her symptoms. She has not been taking any medication for stomach acid, such as Pepcid or omeprazole, as she does not experience burning sensations typical of acid reflux.         Previous HPI 10/15/2023 Maria Sutton is a 29 y.o. 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 She has been experiencing increased symptoms since the beginning of December and again since around Christmas time. The pain has been persistent, varying in intensity but not completely subsiding. 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 They attempted to manage the pain with CBD, which had previously been effective for headaches and migraines, but found no relief. The pain was subtle during the day but intensified at night, often disrupting sleep. The pain would migrate between shoulders, the wrist, and a particular finger.   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 They speculated that the physical strain of the job might be contributing to the pain. Despite attempts to rest and reduce physical strain, the pain persisted.   The patient has been taking methotrexate  for their RA. Recent treatment with prednisone , taken at night to avoid daytime drowsiness, did not alleviate the pain much. They recently tried Celebrex  starting last night, which allowed them to sleep but just one night so far.   The patient noticed visible swelling in the wrist and finger, which persisted for over a week. Prior to Christmas, they sustained an injury at work while handling a large dog. They fell and landed on their wrist, which subsequently began to hurt more. An x-ray at urgent care revealed no fractures, attributing  the pain to inflammation.   The patient has been applying for jobs that would allow them to work from home, as they believe their current job is exacerbating their RA symptoms. They are also concerned about the increasing physical demands and risks of their current job.      Previous HPI 08/25/23 Teirra Carapia is a 28 y.o. 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 weekly folic acid  1 mg daily and as needed diclofenac  75 mg.  Joint pain has been reasonably well-controlled taking diclofenac  1 or 2 times per week.  Previous complaint is persistent fatigue.  She has experienced several episodes of nosebleeding.  This not painful and does not notice any sores or ulcers in the nose.  She is also been noticing some constitutional symptoms with chills at night  going to bed also feeling more fatigued and short of breath quickly with exertion.   Previous HPI 05/26/2023 Krishika Bugge is a 28 y.o. 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 weekly folic acid  1 mg daily and as needed diclofenac  75 mg.  She was sick with upper respiratory infection symptoms 1 time in late May after our last visit initially thought this was allergic rhinitis but then developed increased symptoms.  She was negative for viral or bacterial throat swab tests but ended up finishing a course of antibiotics and had cough that resolved about 3 weeks later.  Shoulder pain has been doing well with the diclofenac  as needed.  Pain in her toe is also doing well and not having much swelling.  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  Has a lot of stress in her work situation.   Previous HPI 02/10/2023 Elanie Hammitt is a 28 y.o. 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 weekly folic acid  1 mg daily and as needed diclofenac  75 mg.  Overall joint inflammation is improved compared to before still requires as needed diclofenac  for shoulder pain but only provoked after more significant overexertion.  She is tolerating the medication okay but thinks she is having some mild side effects.  Experiencing photosensitivity with patchy or bumpy erythematous rashes on sun exposed areas this does better when using sunblock.  She has never noticed this type of rash with sun exposure previously.  Also has been sick with 3 rounds of what sounds like viral URI symptoms since our last visit most recent 1 resolved 3 to 4 weeks ago.   Previous HPI 11/11/22 Marysue Fait is a 28 y.o. female here for follow up for seropositive RA now on methotrexate  15 mg p.o. weekly and folic acid  1 mg daily and as needed diclofenac  75 mg p.o.  We started medication after telemedicine visit due to increased joint pain  symptoms and new identified erosion at the left first MTP joint.  She is tolerating the methotrexate  without any noticeable side effect.  She did have 1 probable cold or other upper respiratory infection that took a week to recover which is longer than usual otherwise no interval infections.  Symptoms are pretty well-controlled she is taking the diclofenac  once every 2 to 3 days at this point.    Previous HPI 03/28/22 Kai Railsback is a 28 y.o. female here for follow up for seropositive RA on HCQ 200 mg daily.  She is overall doing fairly well most frequent problem have been exacerbations of  right shoulder pain.  She notices this most often after a event where she can recall some overuse or catch full use applying a lot of pressure in the area.  She is getting more improvement with turmeric than NSAIDs as needed.  Also had a right ring finger distal fracture without complication.  She saw ophthalmology last year in May but not yet this year.     Previous HPI 09/10/21 Faiga Stones is a 28 y.o. female here for follow up for seropositive RA on hydroxychloroquine  200 mg daily with new development of increased joint pain at several sites.  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  She was having only partial improvement with taking ibuprofen  for the increased joint pain she increased her hydroxychloroquine  to 2 tablets daily as well.  For the past week she is avoided any heavy overhead lifting and right shoulder pain has improved but is also developed increased symptoms in right wrist and left hand PIP joints.  These have not been associated with any significant swelling or erythema.   Previous HPI 04/23/21 Maisy Newport is a 28 y.o. female here for follow up for seropositive RA on hydroxychloroquine  200 mg PO daily.  Since her last visit she has not noticed any  major flareup of joint pain stiffness or swelling.  She not had any more problems since lowering the hydroxychloroquine  to the 1 tablet daily dose.  She stopped taking the medicine for a few periods of time I think she noticed some increased joint pains particularly of the shoulders this improved again.  She was seen at the ED for viral upper respiratory infection a few times in April but no other significant infections.   10/25/2020 Rasheka Denard is a 28 y.o. 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 She decreased her dose of medication from 400mg  to 200mg  due to intolerance and noticed problems such as sleep disturbance, jaw pain, eye twitching she was not sure which related to the medication or underlying issues. She feels that symptoms are partially improved with this, particularly TMJ pain also her right arm pain is improved since last visit and has not had a repeat episode of sharp chest pain. She does continue to have headaches and fatigue and right eyebrow twitching is worse.   DMARD Hx HCQ 08/2020-09/2022 Lack of efficacy MTX 09/2022-current   Labs 08/2020 ANA 1:320 speckled RF 42 CCP >250 SSA >8.0   08/2020 Vitamin D  26.7   Review of Systems  Constitutional:  Positive for fatigue.  HENT:  Positive for mouth sores and mouth dryness.   Eyes:  Positive for dryness.  Respiratory:  Positive for shortness of breath.   Cardiovascular:  Negative for chest pain and palpitations.  Gastrointestinal:  Negative for blood in stool, constipation and diarrhea.  Endocrine: Negative for increased urination.  Genitourinary:  Negative for involuntary urination.  Musculoskeletal:  Positive for joint pain, gait problem, joint pain, joint swelling, myalgias, muscle weakness, morning stiffness, muscle tenderness and myalgias.  Skin:  Positive for hair loss. Negative for color change, rash and sensitivity to sunlight.   Allergic/Immunologic: Negative for susceptible to infections.  Neurological:  Positive for dizziness and headaches.  Hematological:  Negative for swollen glands.  Psychiatric/Behavioral:  Positive for depressed mood and sleep disturbance. The patient  is nervous/anxious.     PMFS History:  Patient Active Problem List   Diagnosis Date Noted   Right wrist sprain 03/12/2024   Low serum cortisol level 08/26/2023   Other fatigue 08/26/2023   Pain in left shoulder 09/24/2022   High risk medication use 09/24/2022   Synovitis of right shoulder 06/24/2022   Plantar fascial fibromatosis of both feet 06/24/2022   Effusion of metatarsophalangeal (MTP) joint of great toe 06/24/2022   Pain in right shoulder 09/10/2021   Rheumatoid arthritis with rheumatoid factor of multiple sites without organ or systems involvement (HCC) 08/28/2020   Positive ANA (antinuclear antibody) 08/22/2020   Vitamin D  deficiency 03/19/2019   Obesity with body mass index (BMI) of 30.0 to 39.9 03/19/2019   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 No pertinent surgical history. Social History   Social History Narrative   Lives with mom and dad   Right handed   Drinks 1 cup caffeine daily      Bruna great aunt an others w/breast ca    There is no immunization history on file for this patient.   Objective: Vital Signs: BP 113/76   Pulse 79   Temp 98.2 F (36.8 C)   Resp 16    Ht 5' 7 (1.702 m)   Wt 234 lb 12.8 oz (106.5 kg)   BMI 36.77 kg/m    Physical Exam Eyes:     Conjunctiva/sclera: Conjunctivae normal.  Cardiovascular:     Rate and Rhythm: Normal rate and regular rhythm.  Pulmonary:     Effort: Pulmonary effort is normal.     Breath sounds: Normal breath sounds.  Lymphadenopathy:     Cervical: No cervical adenopathy.  Skin:    General: Skin is warm and dry.     Comments: Small healing wound on tip of right 2nd finger  Neurological:     Mental Status: She is alert.  Psychiatric:        Mood and Affect: Mood normal.      Musculoskeletal Exam:  Shoulders full ROM no tenderness or swelling Elbows full ROM no tenderness or swelling Wrists full ROM no tenderness or swelling Fingers full ROM no tenderness or swelling Knees full ROM no tenderness or swelling    Investigation: No additional findings.  Imaging: No results found.  Recent Labs: Lab Results  Component Value Date   WBC 6.7 06/21/2024   HGB 15.6 (H) 06/21/2024   PLT 332 06/21/2024   NA 138 06/21/2024   K 4.1 06/21/2024   CL 101 06/21/2024   CO2 28 06/21/2024   GLUCOSE 83 06/21/2024   BUN 9 06/21/2024   CREATININE 0.60 06/21/2024   BILITOT 0.7 06/21/2024   ALKPHOS 67 01/12/2024   AST 33 (H) 06/21/2024   ALT 32 (H) 06/21/2024   PROT 7.2 06/21/2024   ALBUMIN 4.8 01/12/2024  CALCIUM 9.6 06/21/2024   GFRAA 137 08/10/2020    Speciality Comments: PLQ Eye Exam- [facility] 02/2021  Procedures:  No procedures performed Allergies: Patient has no known allergies.   Assessment / Plan:     Visit Diagnoses: Rheumatoid arthritis with rheumatoid factor of multiple sites without organ or systems involvement (HCC) - Plan: methotrexate  (RHEUMATREX) 2.5 MG tablet, Sedimentation rate, folic acid  (FOLVITE ) 1 MG tablet Rheumatoid arthritis is stable on methotrexate  with no significant symptom changes. - Continue methotrexate  20 mg PO weekly. - Continue folic acid  1 mg  daily. - Recheck labs to ensure stability of inflammation markers and blood counts.  High risk medication use - Continue methotrexate  20 mg PO weekly and folic acid  1 mg daily - Plan: CBC with Differential/Platelet, Comprehensive metabolic panel with GFR, Lipid panel - No serious interval infections, ppx Abtx for cat bite on finger. 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 No definitive fibromyalgia diagnosis. Discussed potential overlap and provided educational resources. - Provide web link to the [facility] [place]  Department of Rheumatology for symptom management strategies. - Recheck labs to rule out inflammation or medication side effects as cause of symptoms.  Consideration of glutathione for headache management Discussed Lodethione as a potential treatment for headaches. Safe to try despite lack of evidence-based recommendation. - Consider trial of glutathione for headache management.  Gastroesophageal reflux symptoms, likely antibiotic-related Gastroesophageal reflux likely due to recent antibiotic use. No constipation or diarrhea reported.  Nail pitting, stable Nail pitting is stable and not causing significant concern.        Orders: Orders Placed This Encounter  Procedures   Sedimentation rate   CBC with Differential/Platelet   Comprehensive metabolic panel with GFR   Lipid panel   Meds ordered this encounter  Medications   methotrexate  (RHEUMATREX) 2.5 MG tablet    Sig: Take 8 tablets (20 mg total) by mouth once a week. Caution:Chemotherapy. Protect from light.    Dispense:  104 tablet    Refill:  0   folic acid  (FOLVITE ) 1 MG tablet    Sig: Take 1 tablet (1 mg total)  by mouth daily.    Dispense:  90 tablet    Refill:  3     Follow-Up Instructions: Return in about 3 months (around 09/20/2024) for RA on MTX f/u 3mos.   Lonni LELON Ester, MD  Note - This record has been created using [employer].  Chart creation errors have been sought, but may not always  have been located. Such creation errors do not reflect on  the standard of medical care.

## 2024-06-21 ENCOUNTER — Ambulatory Visit: Attending: Internal Medicine | Admitting: Internal Medicine

## 2024-06-21 ENCOUNTER — Encounter: Payer: Self-pay | Admitting: Internal Medicine

## 2024-06-21 VITALS — BP 113/76 | HR 79 | Temp 98.2°F | Resp 16 | Ht 67.0 in | Wt 234.8 lb

## 2024-06-21 DIAGNOSIS — S63501D Unspecified sprain of right wrist, subsequent encounter: Secondary | ICD-10-CM

## 2024-06-21 DIAGNOSIS — Z79899 Other long term (current) drug therapy: Secondary | ICD-10-CM | POA: Diagnosis not present

## 2024-06-21 DIAGNOSIS — M25512 Pain in left shoulder: Secondary | ICD-10-CM | POA: Diagnosis not present

## 2024-06-21 DIAGNOSIS — M0579 Rheumatoid arthritis with rheumatoid factor of multiple sites without organ or systems involvement: Secondary | ICD-10-CM

## 2024-06-21 MED ORDER — METHOTREXATE SODIUM 2.5 MG PO TABS: 20.0000 mg | ORAL_TABLET | ORAL | 0 refills | Status: DC

## 2024-06-21 MED ORDER — FOLIC ACID 1 MG PO TABS: 1.0000 mg | ORAL_TABLET | Freq: Every day | ORAL | 3 refills | Status: AC

## 2024-06-21 NOTE — Patient Instructions (Signed)
 I recommend checking out the [facility] [place] patient-centered guide for fibromyalgia and chronic pain management: [email or web address]

## 2024-06-22 ENCOUNTER — Encounter: Payer: Self-pay | Admitting: Internal Medicine

## 2024-06-22 LAB — COMPREHENSIVE METABOLIC PANEL WITH GFR
AG Ratio: 1.7 (calc) (ref 1.0–2.5)
ALT: 32 U/L — ABNORMAL HIGH (ref 6–29)
AST: 33 U/L — ABNORMAL HIGH (ref 10–30)
Albumin: 4.5 g/dL (ref 3.6–5.1)
Alkaline phosphatase (APISO): 73 U/L (ref 31–125)
BUN: 9 mg/dL (ref 7–25)
CO2: 28 mmol/L (ref 20–32)
Calcium: 9.6 mg/dL (ref 8.6–10.2)
Chloride: 101 mmol/L (ref 98–110)
Creat: 0.6 mg/dL (ref 0.50–0.96)
Globulin: 2.7 g/dL (ref 1.9–3.7)
Glucose, Bld: 83 mg/dL (ref 65–99)
Potassium: 4.1 mmol/L (ref 3.5–5.3)
Sodium: 138 mmol/L (ref 135–146)
Total Bilirubin: 0.7 mg/dL (ref 0.2–1.2)
Total Protein: 7.2 g/dL (ref 6.1–8.1)
eGFR: 127 mL/min/1.73m2 (ref >=60)

## 2024-06-22 LAB — CBC WITH DIFFERENTIAL/PLATELET
Absolute Lymphocytes: 1836 {cells}/uL (ref 850–3900)
Absolute Monocytes: 496 {cells}/uL (ref 200–950)
Basophils Absolute: 20 {cells}/uL (ref 0–200)
Basophils Relative: 0.3 %
Eosinophils Absolute: 154 {cells}/uL (ref 15–500)
Eosinophils Relative: 2.3 %
HCT: 46.7 % — ABNORMAL HIGH (ref 35.0–45.0)
Hemoglobin: 15.6 g/dL — ABNORMAL HIGH (ref 11.7–15.5)
MCH: 28.8 pg (ref 27.0–33.0)
MCHC: 33.4 g/dL (ref 32.0–36.0)
MCV: 86.2 fL (ref 80.0–100.0)
MPV: 10.3 fL (ref 7.5–12.5)
Monocytes Relative: 7.4 %
Neutro Abs: 4194 {cells}/uL (ref 1500–7800)
Neutrophils Relative %: 62.6 %
Platelets: 332 Thousand/uL (ref 140–400)
RBC: 5.42 Million/uL — ABNORMAL HIGH (ref 3.80–5.10)
RDW: 13 % (ref 11.0–15.0)
Total Lymphocyte: 27.4 %
WBC: 6.7 Thousand/uL (ref 3.8–10.8)

## 2024-06-22 LAB — LIPID PANEL
Cholesterol: 175 mg/dL (ref <200)
HDL: 46 mg/dL — ABNORMAL LOW (ref >=50)
LDL Cholesterol (Calc): 107 mg/dL — ABNORMAL HIGH
Non-HDL Cholesterol (Calc): 129 mg/dL (ref <130)
Total CHOL/HDL Ratio: 3.8 (calc) (ref <5.0)
Triglycerides: 119 mg/dL (ref <150)

## 2024-06-22 LAB — SEDIMENTATION RATE: Sed Rate: 17 mm/h (ref 0–20)

## 2024-06-28 NOTE — Telephone Encounter (Signed)
 Patient called the office requesting her lab results. Please review and advise.

## 2024-07-27 ENCOUNTER — Encounter: Admitting: Family Medicine

## 2024-08-29 ENCOUNTER — Telehealth: Admitting: Nurse Practitioner

## 2024-08-29 DIAGNOSIS — M25511 Pain in right shoulder: Secondary | ICD-10-CM

## 2024-08-29 MED ORDER — CELECOXIB 200 MG PO CAPS: 200.0000 mg | ORAL_CAPSULE | Freq: Two times a day (BID) | ORAL | 0 refills | Status: DC | PRN

## 2024-08-29 NOTE — Progress Notes (Signed)
 Virtual Visit Consent   Maria Sutton, you are scheduled for a virtual visit with a [facility] provider today. Just as with appointments in the office, your consent must be obtained to participate. Your consent will be active for this visit and any virtual visit you may have with one of our providers in the next 365 days. If you have a MyChart account, a copy of this consent can be sent to you electronically.  As this is a virtual visit, video technology does not allow for your provider to perform a traditional examination. This may limit your provider's ability to fully assess your condition. If your provider identifies any concerns that need to be evaluated in person or the need to arrange testing (such as labs, EKG, etc.), we will make arrangements to do so. Although advances in technology are sophisticated, we cannot ensure that it will always work on either your end or our end. If the connection with a video visit is poor, the visit may have to be switched to a telephone visit. With either a video or telephone visit, we are not always able to ensure that we have a secure connection.  By engaging in this virtual visit, you consent to the provision of healthcare and authorize for your insurance to be billed (if applicable) for the services provided during this visit. Depending on your insurance coverage, you may receive a charge related to this service.  I need to obtain your verbal consent now. Are you willing to proceed with your visit today? Maria Sutton has provided verbal consent on 08/29/2024 for a virtual visit (video or telephone). Haze LELON Servant, NP  Date: 08/29/2024 9:22 AM   Virtual Visit via Video Note   I, Haze LELON Servant, connected with  Maria Sutton  ([id number], 04/16/1996) on 08/29/24 at  9:15 AM EST by a video-enabled telemedicine application and verified that I am speaking with the correct person using two identifiers.  Location: Patient: Virtual Visit Location Patient:  Home Provider: Virtual Visit Location Provider: Home Office   I discussed the limitations of evaluation and management by telemedicine and the availability of in person appointments. The patient expressed understanding and agreed to proceed.    History of Present Illness: Maria Sutton is a 28 y.o. who identifies as a female who was assigned female at birth, and is being seen today for acute right shoulder pain.   Maria Sutton has been experiencing acute right shoulder pain for the past 4 days. Unrelated to any injury or trauma. She has a history of RA.  She has been taking ibuprofen  and diclofenac  with no improvement. Requesting celebrex  which she has taken before with methotrexate .   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   Other fatigue 08/26/2023   Pain in left shoulder 09/24/2022   High risk medication use 09/24/2022   Synovitis of right shoulder 06/24/2022   Plantar fascial fibromatosis of both feet 06/24/2022   Effusion of metatarsophalangeal (MTP) joint of great toe 06/24/2022   Pain in right shoulder 09/10/2021   Rheumatoid arthritis with rheumatoid factor of multiple sites without organ or systems involvement (HCC) 08/28/2020   Positive ANA (antinuclear antibody) 08/22/2020   Vitamin D  deficiency 03/19/2019   Obesity with body mass index (BMI) of 30.0 to 39.9 03/19/2019   Reactive depression 09/25/2018   Migraine without aura and without status migrainosus, not intractable 09/25/2018   Cognitive complaints  09/25/2018    Allergies: No Known Allergies Medications:  Current Outpatient Medications:    celecoxib  (CELEBREX ) 200 MG capsule, Take 1 capsule (200 mg total) by mouth 2 (two) times daily as needed., Disp: 30 capsule, Rfl: 0   amoxicillin -clavulanate (AUGMENTIN) 875-125 MG tablet, SMARTSIG:1 Tablet(s) By Mouth Morning-Evening, Disp: , Rfl:    folic acid  (FOLVITE ) 1 MG tablet,  Take 1 tablet (1 mg total) by mouth daily., Disp: 90 tablet, Rfl: 3   methotrexate  (RHEUMATREX) 2.5 MG tablet, Take 8 tablets (20 mg total) by mouth once a week. Caution:Chemotherapy. Protect from light., Disp: 104 tablet, Rfl: 0   Multiple Vitamins-Minerals (MULTIVITAMIN WITH MINERALS) tablet, Take 1 tablet by mouth daily., Disp: , Rfl:    SLYND  4 MG TABS, TAKE ONE (1) TABLET (4 MG TOTAL) BY MOUTH DAILY., Disp: 84 tablet, Rfl: 1  Observations/Objective: Patient is well-developed, well-nourished in no acute distress.  Resting comfortably at home.  Head is normocephalic, atraumatic.  No labored breathing.  Speech is clear and coherent with logical content.  Patient is alert and oriented at baseline.    Assessment and Plan: 1. Acute pain of right shoulder (Primary) - celecoxib  (CELEBREX ) 200 MG capsule; Take 1 capsule (200 mg total) by mouth 2 (two) times daily as needed.  Dispense: 30 capsule; Refill: 0    Follow Up Instructions: I discussed the assessment and treatment plan with the patient. The patient was provided an opportunity to ask questions and all were answered. The patient agreed with the plan and demonstrated an understanding of the instructions.  A copy of instructions were sent to the patient via MyChart unless otherwise noted below.     The patient was advised to call back or seek an in-person evaluation if the symptoms worsen or if the condition fails to improve as anticipated.    Australia Droll W Arnet Hofferber, NP

## 2024-08-29 NOTE — Patient Instructions (Signed)
  Gwenette Griffith, thank you for joining Haze LELON Servant, NP for today's virtual visit.  While this provider is not your primary care provider (PCP), if your PCP is located in our provider database this encounter information will be shared with them immediately following your visit.   A [facility] MyChart account gives you access to today's visit and all your visits, tests, and labs performed at [facility]  click here if you don't have a [facility] MyChart account or go to [email or web address]  Consent: (Patient) Brienne Liguori provided verbal consent for this virtual visit at the beginning of the encounter.  Current Medications:  Current Outpatient Medications:    celecoxib  (CELEBREX ) 200 MG capsule, Take 1 capsule (200 mg total) by mouth 2 (two) times daily as needed., Disp: 30 capsule, Rfl: 0   amoxicillin -clavulanate (AUGMENTIN) 875-125 MG tablet, SMARTSIG:1 Tablet(s) By Mouth Morning-Evening, Disp: , Rfl:    folic acid  (FOLVITE ) 1 MG tablet, Take 1 tablet (1 mg total) by mouth daily., Disp: 90 tablet, Rfl: 3   methotrexate  (RHEUMATREX) 2.5 MG tablet, Take 8 tablets (20 mg total) by mouth once a week. Caution:Chemotherapy. Protect from light., Disp: 104 tablet, Rfl: 0   Multiple Vitamins-Minerals (MULTIVITAMIN WITH MINERALS) tablet, Take 1 tablet by mouth daily., Disp: , Rfl:    SLYND  4 MG TABS, TAKE ONE (1) TABLET (4 MG TOTAL) BY MOUTH DAILY., Disp: 84 tablet, Rfl: 1   Medications ordered in this encounter:  Meds ordered this encounter  Medications   celecoxib  (CELEBREX ) 200 MG capsule    Sig: Take 1 capsule (200 mg total) by mouth 2 (two) times daily as needed.    Dispense:  30 capsule    Refill:  0    Supervising Provider:   BLAISE ALEENE KIDD [[id number]]     *If you need refills on other medications prior to your next appointment, please contact your pharmacy*  Follow-Up: Call back or seek an in-person evaluation if the symptoms worsen or if the condition fails to  improve as anticipated.  [facility] [phone]  Other Instructions    If you have been instructed to have an in-person evaluation today at a local Urgent Care facility, please use the link below. It will take you to a list of all of our available [facility], including address, phone number and hours of operation. Please do not delay care.  [facility]  If you or a family member do not have a primary care provider, use the link below to schedule a visit and establish care. When you choose a [facility] primary care physician or advanced practice provider, you gain a long-term partner in health. Find a Primary Care Provider  Learn more about [facility]'s in-office and virtual care options:  - Get Care Now

## 2024-08-31 NOTE — Progress Notes (Signed)
 "  Office Visit Note  Patient: Maria Sutton             Date of Birth: [birth year]           MRN: [id number]             PCP: Wendolyn Jenkins Jansky, MD Referring: Wendolyn Jenkins Jansky, MD Visit Date: 09/13/2024   Subjective:   Discussed the use of AI scribe software for clinical note transcription with the patient, who gave verbal consent to proceed.  History of Present Illness   Maria Sutton is a 28 y.o. 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  She presents with a flare-up of right shoulder pain.  She has been experiencing a flare-up of right shoulder pain since her last visit. Initially, she managed the pain with ibuprofen  twice daily and diclofenac , but these were ineffective. The pain intensified, causing her to wake up at 4 AM, prompting her to take Celebrex , which alleviated the pain. She has been taking Celebrex  as needed, initially twice daily, and stopped last Monday. The pain has started to return slightly, particularly in the mornings, but subsides during the day.  She denies any recent work-related activities that could have exacerbated the shoulder pain, noting a period of minimal physical activity at work. She experiences morning stiffness primarily in her spine, with recent stiffness in her neck and upper spine. She uses a massage device on her spine each morning, which she finds helpful.  She reports a recent mild flu starting last Tuesday, which she worked through due to concerns about being written up for sick leave. She also mentions a fall in the shower last month, resulting in a bruised leg and difficulty bending it, which led her to leave work early.  She continues to take methotrexate  as usual. No recent animal bites or infections requiring antibiotics. She experiences intermittent muscle weakness, particularly when lifting her dog, with shaking in her leg muscles, which she attributes to cold weather. This muscle  weakness is not consistently present.  No issues with her hands, feet, or toes. No severe morning stiffness elsewhere besides her spine and neck. No adverse effects from Celebrex , aside from mild fatigue.       Previous HPI 06/21/2024 Maria Sutton is a 28 y.o. 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    She continues on methotrexate  20 mg orally once weekly and folic acid  1 mg daily. She uses ibuprofen  occasionally, about once or twice a month, but recently increased to twice daily for three days due to a painful cat bite on her hand. The inflammation from the bite resolved with ibuprofen  use.   She experienced an upper respiratory infection since her last visit, characterized by white phlegm on her tonsils. A throat culture was performed, but she did not receive the results. She managed symptoms with numbing spray and self-care, and the symptoms eventually resolved.   She reports increased fatigue and sleep disturbances, noting that she wakes up at least once during the night and feels fatigued throughout the day. She also experiences occasional dizziness and has noticed memory issues, such as forgetting conversations mid-sentence. Her roommate has observed these memory lapses as well.     Previous HPI 03/12/2024 Maria Sutton is a 28 y.o. 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   She has been experiencing a flare-up in her left shoulder since yesterday, with pain intensifying at night and upon waking. Ibuprofen  is used for pain management, but the discomfort increases in the evening and during sleep. Morning stiffness, particularly in the spine, has been more intense over the past two weeks, and she stretches in the morning to alleviate it.   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 A splint is worn for  the wrist injury, and she notes that her left shoulder pain may be exacerbated by increased use of her left arm due to the wrist injury.   She has a history of ruptured ovarian cysts, with the most recent episode occurring at the end of March, causing abdominal pain for four days before seeking emergency care. A ruptured cyst was diagnosed, and she has been on birth control for many years, which she believes has helped manage the cysts, with no further ruptures since.   Currently, she is taking methotrexate  for rheumatoid arthritis, which causes nausea, particularly on the day of administration. She has tried splitting the dose but finds it more manageable to take it all on Sunday to avoid nausea during work on Monday. She reports having some good days without nausea and some bad days where nausea persists throughout the day.   An elevated bilirubin level of 1.6 was noted in March, which has since decreased. She has no history of gallbladder issues and attributes the elevation to the ruptured cyst at the time. No major illnesses or need for antibiotics have been reported recently.   She is in the process of applying for FMLA due to frequent absences from work related to her rheumatoid arthritis symptoms, which have been affecting her ability to work consistently. She reports feeling drained and experiencing symptoms like nausea and headaches, particularly towards the end of her work periods.         Previous HPI 12/15/2023 Maria Sutton is a 28 y.o. 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 We tried addition of celebrex  200 mg PRN for breakthrough joint pain especially in right arm and with use.   She experiences gastrointestinal issues, including upset stomach and nausea, which worsened significantly about two weeks ago while taking Celebrex . The symptoms became severe enough to prevent her from eating properly, as food would not digest.  She discontinued Celebrex , leading to a slight return of inflammation symptoms, but noted less frequent stomach issues since stopping, although some nausea persists.   She continues to take methotrexate , which causes milder stomach upset, particularly on Sundays when she takes it. She has not tried methotrexate  injections due to discomfort with self-administration. She takes all doses at once, causing symptoms about an hour after ingestion, which improve throughout the day.   Regarding her rheumatoid arthritis, she reports no visible swelling but describes intermittent pain in her elbow, initially thought to be joint-related but later identified as localized pain. She experiences muscle weakness, particularly when lifting or running, where her muscles 'just give out.'   She has a history of IBS but has not experienced significant reflux or stomach issues prior to the recent events. Apples help alleviate some of her nausea. She describes her experience with Celebrex  as having 'good stomach days or bad stomach days,' with more frequent bad days when on the medication. No burning sensation  associated with acid reflux, and ginger ale helps with her symptoms. She has not been taking any medication for stomach acid, such as Pepcid or omeprazole, as she does not experience burning sensations typical of acid reflux.         Previous HPI 10/15/2023 Jesi Jurgens is a 28 y.o. 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 She has been experiencing increased symptoms since the beginning of December and again since around Christmas time. The pain has been persistent, varying in intensity but not completely subsiding. 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 They attempted to manage the pain with CBD, which had previously been effective for headaches and migraines, but found no relief.  The pain was subtle during the day but intensified at night, often disrupting sleep. The pain would migrate between shoulders, the wrist, and a particular finger.   The patient works in research scientist (life sciences), frequently lifting and handling dogs. They speculated that the physical strain of the job might be contributing to the pain. Despite attempts to rest and reduce physical strain, the pain persisted.   The patient has been taking methotrexate  for their RA. Recent treatment with prednisone , taken at night to avoid daytime drowsiness, did not alleviate the pain much. They recently tried Celebrex  starting last night, which allowed them to sleep but just one night so far.   The patient noticed visible swelling in the wrist and finger, which persisted for over a week. Prior to Christmas, they sustained an injury at work while handling a large dog. They fell and landed on their wrist, which subsequently began to hurt more. An x-ray at urgent care revealed no fractures, attributing the pain to inflammation.   The patient has been applying for jobs that would allow them to work from home, as they believe their current job is exacerbating their RA symptoms. They are also concerned about the increasing physical demands and risks of their current job.      Previous HPI 08/25/23 Brytni Dray is a 28 y.o. 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 weekly folic acid  1 mg daily and as needed diclofenac  75 mg.  Joint pain has been reasonably well-controlled taking diclofenac  1 or 2 times per week.  Previous complaint is persistent fatigue.  She has experienced several episodes of nosebleeding.  This not painful and does not notice any sores or ulcers in the nose.  She is also been noticing some constitutional symptoms with chills at night going to bed also feeling more fatigued and short of breath quickly with exertion.   Previous HPI 05/26/2023 Ceanna Wareing is a 28 y.o. female here for  follow up for seropositive RA on methotrexate  15 mg p.o. weekly folic acid  1 mg daily and as needed diclofenac  75 mg.  She was sick with upper respiratory infection symptoms 1 time in late May after our last visit initially thought this was allergic rhinitis but then developed increased symptoms.  She was negative for viral or bacterial throat swab tests but ended up finishing a course of antibiotics and had cough that resolved about 3 weeks later.  Shoulder pain has been doing well with the diclofenac  as needed.  Pain in her toe is also doing well and not having much swelling.  She still getting some increased pain and feels like she is getting sick after  long on-call shifts.  Has a lot of stress in her work situation.   Previous HPI 02/10/2023 Ady Heimann is a 28 y.o. 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 weekly folic acid  1 mg daily and as needed diclofenac  75 mg.  Overall joint inflammation is improved compared to before still requires as needed diclofenac  for shoulder pain but only provoked after more significant overexertion.  She is tolerating the medication okay but thinks she is having some mild side effects.  Experiencing photosensitivity with patchy or bumpy erythematous rashes on sun exposed areas this does better when using sunblock.  She has never noticed this type of rash with sun exposure previously.  Also has been sick with 3 rounds of what sounds like viral URI symptoms since our last visit most recent 1 resolved 3 to 4 weeks ago.   Previous HPI 11/11/22 Kandyce Dieguez is a 28 y.o. female here for follow up for seropositive RA now on methotrexate  15 mg p.o. weekly and folic acid  1 mg daily and as needed diclofenac  75 mg p.o.  We started medication after telemedicine visit due to increased joint pain symptoms and new identified erosion at the left first MTP joint.  She is tolerating the methotrexate  without any noticeable side effect.  She did have 1 probable  cold or other upper respiratory infection that took a week to recover which is longer than usual otherwise no interval infections.  Symptoms are pretty well-controlled she is taking the diclofenac  once every 2 to 3 days at this point.    Previous HPI 03/28/22 Niquita Digioia is a 28 y.o. female here for follow up for seropositive RA on HCQ 200 mg daily.  She is overall doing fairly well most frequent problem have been exacerbations of right shoulder pain.  She notices this most often after a event where she can recall some overuse or catch full use applying a lot of pressure in the area.  She is getting more improvement with turmeric than NSAIDs as needed.  Also had a right ring finger distal fracture without complication.  She saw ophthalmology last year in May but not yet this year.     Previous HPI 09/10/21 Tanza Pellot is a 28 y.o. female here for follow up for seropositive RA on hydroxychloroquine  200 mg daily with new development of increased joint pain at several sites.  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  She was having only partial improvement with taking ibuprofen  for the increased joint pain she increased her hydroxychloroquine  to 2 tablets daily as well.  For the past week she is avoided any heavy overhead lifting and right shoulder pain has improved but is also developed increased symptoms in right wrist and left hand PIP joints.  These have not been associated with any significant swelling or erythema.   Previous HPI 04/23/21 Nefertiti Mohamad is a 28 y.o. female here for follow up for seropositive RA on hydroxychloroquine  200 mg PO daily.  Since her last visit she has not noticed any major flareup of joint pain stiffness or swelling.  She not had any more problems since lowering the hydroxychloroquine  to the 1 tablet daily dose.  She stopped  taking the medicine for a few periods of time I think she noticed some increased joint pains particularly of the shoulders this  improved again.  She was seen at the ED for viral upper respiratory infection a few times in April but no other significant infections.   10/25/2020 Cyleigh Massaro is a 28 y.o. 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 She decreased her dose of medication from 400mg  to 200mg  due to intolerance and noticed problems such as sleep disturbance, jaw pain, eye twitching she was not sure which related to the medication or underlying issues. She feels that symptoms are partially improved with this, particularly TMJ pain also her right arm pain is improved since last visit and has not had a repeat episode of sharp chest pain. She does continue to have headaches and fatigue and right eyebrow twitching is worse.   DMARD Hx HCQ 08/2020-09/2022 Lack of efficacy MTX 09/2022-current   Labs 08/2020 ANA 1:320 speckled RF 42 CCP >250 SSA >8.0   08/2020 Vitamin D  26.7   Review of Systems  Constitutional:  Positive for fatigue.  HENT:  Positive for mouth sores. Negative for mouth dryness.   Eyes:  Positive for dryness.  Respiratory:  Positive for shortness of breath.   Cardiovascular:  Negative for chest pain and palpitations.  Gastrointestinal:  Negative for blood in stool, constipation and diarrhea.  Endocrine: Negative for increased urination.  Genitourinary:  Negative for involuntary urination.  Musculoskeletal:  Positive for joint pain, gait problem, joint pain, joint swelling, myalgias, muscle weakness, morning stiffness, muscle tenderness and myalgias.  Skin:  Positive for rash and hair loss. Negative for color change and sensitivity to sunlight.  Allergic/Immunologic: Negative for susceptible to infections.  Neurological:  Positive for dizziness and headaches.  Hematological:  Negative  for swollen glands.  Psychiatric/Behavioral:  Positive for depressed mood and sleep disturbance. The patient is nervous/anxious.     PMFS History:  Patient Active Problem List   Diagnosis Date Noted   Right wrist sprain 03/12/2024   Low serum cortisol level 08/26/2023   Other fatigue 08/26/2023   Pain in left shoulder 09/24/2022   High risk medication use 09/24/2022   Synovitis of right shoulder 06/24/2022   Plantar fascial fibromatosis of both feet 06/24/2022   Effusion of metatarsophalangeal (MTP) joint of great toe 06/24/2022   Pain in right shoulder 09/10/2021   Rheumatoid arthritis with rheumatoid factor of multiple sites without organ or systems involvement (HCC) 08/28/2020   Positive ANA (antinuclear antibody) 08/22/2020   Vitamin D  deficiency 03/19/2019   Obesity with body mass index (BMI) of 30.0 to 39.9 03/19/2019   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 No pertinent surgical history. Social History   Social History Narrative   Lives with mom and dad   Right handed   Drinks 1 cup caffeine daily      Bruna great aunt an others w/breast ca  There is no immunization history on file for this patient.   Objective: Vital Signs: BP (!) 142/82   Pulse 78   Temp 97.9 F (36.6 C)   Resp 16   Ht 5' 7 (1.702 m)   Wt 239 lb 9.6 oz (108.7 kg)   BMI 37.53 kg/m    Physical Exam Eyes:     Conjunctiva/sclera: Conjunctivae  normal.  Cardiovascular:     Rate and Rhythm: Normal rate and regular rhythm.  Pulmonary:     Effort: Pulmonary effort is normal.     Breath sounds: Normal breath sounds.  Skin:    General: Skin is warm and dry.  Neurological:     Mental Status: She is alert.  Psychiatric:        Mood and Affect: Mood normal.      Musculoskeletal Exam:  Right shoulder pain with movement, worse active than passive tenderness Elbows full ROM no tenderness or swelling Wrists full ROM no tenderness or swelling Fingers full ROM no tenderness or swelling Knees full ROM no tenderness or swelling   Investigation: No additional findings.  Imaging: No results found.  Recent Labs: Lab Results  Component Value Date   WBC 7.2 09/13/2024   HGB 15.9 (H) 09/13/2024   PLT 338 09/13/2024   NA 138 09/13/2024   K 4.2 09/13/2024   CL 103 09/13/2024   CO2 27 09/13/2024   GLUCOSE 79 09/13/2024   BUN 8 09/13/2024   CREATININE 0.68 09/13/2024   BILITOT 0.6 09/13/2024   ALKPHOS 67 01/12/2024   AST 73 (H) 09/13/2024   ALT 67 (H) 09/13/2024   PROT 7.6 09/13/2024   ALBUMIN 4.8 01/12/2024   CALCIUM 9.6 09/13/2024   GFRAA 137 08/10/2020    Speciality Comments: PLQ Eye Exam- [facility] 02/2021  Procedures:  No procedures performed Allergies: Patient has no known allergies.   Assessment / Plan:     Visit Diagnoses: Rheumatoid arthritis with rheumatoid factor of multiple sites without organ or systems involvement (HCC) - Plan: Sedimentation rate, methotrexate  (RHEUMATREX) 2.5 MG tablet Rheumatoid arthritis is stable on methotrexate  with no significant symptom changes. - Continue methotrexate  20 mg PO weekly. - Continue folic acid  1 mg daily. - Recheck labs to ensure stability of inflammation markers and blood counts.  High risk medication use - methotrexate  20 mg PO weekly and folic acid  1 mg daily - Plan: CBC with Differential/Platelet, Comprehensive metabolic panel with GFR  No serious interval  infections, ppx Abtx for cat bite on finger. - Checking CBC, CMP, lipid panel for medication monitoring long term use of methotrexate  for RA  Muscle weakness - Plan: CK  Acute pain of right shoulder - Plan: celecoxib  (CELEBREX ) 200 MG capsule Suspect more use related possibly chronic tendinitis or bursitis component no obvious inflammation and no peripheral synovitis. - Prescribed Celebrex  as needed for flare-ups.      Orders: Orders Placed This Encounter  Procedures   Sedimentation rate   CK   CBC with Differential/Platelet   Comprehensive metabolic panel with GFR   Meds ordered this encounter  Medications   methotrexate  (RHEUMATREX) 2.5 MG tablet    Sig: Take 8 tablets (20 mg total) by mouth once a week. Caution:Chemotherapy. Protect from light.    Dispense:  104 tablet    Refill:  0   celecoxib  (CELEBREX ) 200 MG capsule    Sig: Take 1 capsule (200 mg total) by mouth 2 (two) times daily as needed.    Dispense:  60  capsule    Refill:  0     Follow-Up Instructions: Return in about 3 months (around 12/12/2024) for RA on MTX f/u 3mos.   Lonni LELON Ester, MD  Note - This record has been created using [employer].  Chart creation errors have been sought, but may not always  have been located. Such creation errors do not reflect on  the standard of medical care. "

## 2024-09-13 ENCOUNTER — Encounter: Payer: Self-pay | Admitting: Internal Medicine

## 2024-09-13 ENCOUNTER — Ambulatory Visit: Attending: Internal Medicine | Admitting: Internal Medicine

## 2024-09-13 VITALS — BP 142/82 | HR 78 | Temp 97.9°F | Resp 16 | Ht 67.0 in | Wt 239.6 lb

## 2024-09-13 DIAGNOSIS — M0579 Rheumatoid arthritis with rheumatoid factor of multiple sites without organ or systems involvement: Secondary | ICD-10-CM

## 2024-09-13 DIAGNOSIS — M25511 Pain in right shoulder: Secondary | ICD-10-CM

## 2024-09-13 DIAGNOSIS — M6281 Muscle weakness (generalized): Secondary | ICD-10-CM | POA: Diagnosis not present

## 2024-09-13 DIAGNOSIS — Z79899 Other long term (current) drug therapy: Secondary | ICD-10-CM | POA: Diagnosis not present

## 2024-09-13 MED ORDER — CELECOXIB 200 MG PO CAPS: 200.0000 mg | ORAL_CAPSULE | Freq: Two times a day (BID) | ORAL | 0 refills | Status: AC | PRN

## 2024-09-13 MED ORDER — METHOTREXATE SODIUM 2.5 MG PO TABS: 20.0000 mg | ORAL_TABLET | ORAL | 0 refills | Status: DC

## 2024-09-14 LAB — CBC WITH DIFFERENTIAL/PLATELET
Absolute Lymphocytes: 2383 {cells}/uL (ref 850–3900)
Absolute Monocytes: 403 {cells}/uL (ref 200–950)
Basophils Absolute: 29 {cells}/uL (ref 0–200)
Basophils Relative: 0.4 %
Eosinophils Absolute: 79 {cells}/uL (ref 15–500)
Eosinophils Relative: 1.1 %
HCT: 48.1 % — ABNORMAL HIGH (ref 35.9–46.0)
Hemoglobin: 15.9 g/dL — ABNORMAL HIGH (ref 11.7–15.5)
MCH: 27.9 pg (ref 27.0–33.0)
MCHC: 33.1 g/dL (ref 31.6–35.4)
MCV: 84.4 fL (ref 81.4–101.7)
MPV: 10.5 fL (ref 7.5–12.5)
Monocytes Relative: 5.6 %
Neutro Abs: 4306 {cells}/uL (ref 1500–7800)
Neutrophils Relative %: 59.8 %
Platelets: 338 Thousand/uL (ref 140–400)
RBC: 5.7 Million/uL — ABNORMAL HIGH (ref 3.80–5.10)
RDW: 12.7 % (ref 11.0–15.0)
Total Lymphocyte: 33.1 %
WBC: 7.2 Thousand/uL (ref 3.8–10.8)

## 2024-09-14 LAB — COMPREHENSIVE METABOLIC PANEL WITH GFR
AG Ratio: 1.8 (calc) (ref 1.0–2.5)
ALT: 67 U/L — ABNORMAL HIGH (ref 6–29)
AST: 73 U/L — ABNORMAL HIGH (ref 10–30)
Albumin: 4.9 g/dL (ref 3.6–5.1)
Alkaline phosphatase (APISO): 64 U/L (ref 31–125)
BUN: 8 mg/dL (ref 7–25)
CO2: 27 mmol/L (ref 20–32)
Calcium: 9.6 mg/dL (ref 8.6–10.2)
Chloride: 103 mmol/L (ref 98–110)
Creat: 0.68 mg/dL (ref 0.50–0.96)
Globulin: 2.7 g/dL (ref 1.9–3.7)
Glucose, Bld: 79 mg/dL (ref 65–99)
Potassium: 4.2 mmol/L (ref 3.5–5.3)
Sodium: 138 mmol/L (ref 135–146)
Total Bilirubin: 0.6 mg/dL (ref 0.2–1.2)
Total Protein: 7.6 g/dL (ref 6.1–8.1)
eGFR: 122 mL/min/1.73m2 (ref >=60)

## 2024-09-14 LAB — SEDIMENTATION RATE: Sed Rate: 19 mm/h (ref 0–20)

## 2024-09-14 LAB — CK: Total CK: 36 U/L (ref 20–239)

## 2024-09-23 ENCOUNTER — Telehealth: Payer: Self-pay | Admitting: *Deleted

## 2024-09-23 NOTE — Telephone Encounter (Signed)
 Patient contacted the office and left message requesting her lab results. Please review and advise.

## 2024-10-04 ENCOUNTER — Encounter: Payer: Self-pay | Admitting: Internal Medicine

## 2024-10-04 DIAGNOSIS — M0579 Rheumatoid arthritis with rheumatoid factor of multiple sites without organ or systems involvement: Secondary | ICD-10-CM

## 2024-10-13 NOTE — Telephone Encounter (Signed)
 Patient called the office and left a message asking about her lab results. Patients says it has been a month since labs were drawn and she has not heard anything as well as she has sent MyChart messages as well. Please advise

## 2024-10-14 MED ORDER — METHOTREXATE SODIUM 2.5 MG PO TABS: 10.0000 mg | ORAL_TABLET | ORAL | Status: AC

## 2024-10-14 NOTE — Telephone Encounter (Signed)
 Now addressed in associated MyChart note

## 2024-10-20 ENCOUNTER — Ambulatory Visit: Payer: Self-pay

## 2024-10-20 NOTE — Telephone Encounter (Signed)
 Noted appointment with Job on 10/25/24

## 2024-10-20 NOTE — Telephone Encounter (Signed)
 Pt refused appt earlier than Jan 19. ED precautions reviewed, pt verbalized understanding.  FYI Only or Action Required?: FYI only for provider: appointment scheduled on 10/25/24.  Patient was last seen in primary care on 08/29/2024 by Theotis Haze ORN, NP.  Called Nurse Triage reporting Dizziness.  Symptoms began 2 years ago.  Interventions attempted: Nothing.  Symptoms are: unchanged.  Triage Disposition: See Physician Within 24 Hours  Patient/caregiver understands and will follow disposition?: No  Reason for Disposition  [1] MODERATE dizziness (e.g., interferes with normal activities) AND [2] has NOT been evaluated by doctor (or NP/PA) for this  (Exception: Dizziness caused by heat exposure, sudden standing, or poor fluid intake.)  Answer Assessment - Initial Assessment Questions Pt reports chronic episodes of dizziness lasting 1 second with position changes or bending over. Pt reports hx of lupus and RA. Would like to discuss referral to a different neurologist than she saw in the past.   1. DESCRIPTION: Describe your dizziness.     Feels out of balance when bending or with other positional changes, lasts approx 1 second.   2. LIGHTHEADED: Do you feel lightheaded? (e.g., somewhat faint, woozy, weak upon standing)     Feels off balance x 1 second  3. VERTIGO: Do you feel like either you or the room is spinning or tilting? (i.e., vertigo)     Denies  4. SEVERITY: How bad is it?  Do you feel like you are going to faint? Can you stand and walk?     After one second  5. ONSET:  When did the dizziness begin?     2 years  6. AGGRAVATING FACTORS: Does anything make it worse? (e.g., standing, change in head position)     Bending/moving  Protocols used: Dizziness - Lightheadedness-A-AH

## 2024-10-25 ENCOUNTER — Ambulatory Visit: Admitting: Physician Assistant

## 2024-10-25 ENCOUNTER — Encounter: Payer: Self-pay | Admitting: Physician Assistant

## 2024-10-25 VITALS — BP 124/80 | HR 79 | Temp 97.8°F | Ht 67.0 in | Wt 238.0 lb

## 2024-10-25 DIAGNOSIS — R42 Dizziness and giddiness: Secondary | ICD-10-CM | POA: Diagnosis not present

## 2024-10-25 DIAGNOSIS — R5383 Other fatigue: Secondary | ICD-10-CM

## 2024-10-25 DIAGNOSIS — E038 Other specified hypothyroidism: Secondary | ICD-10-CM | POA: Diagnosis not present

## 2024-10-25 DIAGNOSIS — E559 Vitamin D deficiency, unspecified: Secondary | ICD-10-CM

## 2024-10-25 LAB — CBC WITH DIFFERENTIAL/PLATELET
Basophils Absolute: 0 K/uL (ref 0.0–0.1)
Basophils Relative: 0.4 % (ref 0.0–3.0)
Eosinophils Absolute: 0.1 K/uL (ref 0.0–0.7)
Eosinophils Relative: 1.7 % (ref 0.0–5.0)
HCT: 44.6 % (ref 36.0–46.0)
Hemoglobin: 15.2 g/dL — ABNORMAL HIGH (ref 12.0–15.0)
Lymphocytes Relative: 35 % (ref 12.0–46.0)
Lymphs Abs: 2.1 K/uL (ref 0.7–4.0)
MCHC: 34 g/dL (ref 30.0–36.0)
MCV: 85.1 fl (ref 78.0–100.0)
Monocytes Absolute: 0.3 K/uL (ref 0.1–1.0)
Monocytes Relative: 5.6 % (ref 3.0–12.0)
Neutro Abs: 3.4 K/uL (ref 1.4–7.7)
Neutrophils Relative %: 57.3 % (ref 43.0–77.0)
Platelets: 290 K/uL (ref 150.0–400.0)
RBC: 5.24 Mil/uL — ABNORMAL HIGH (ref 3.87–5.11)
RDW: 13.7 % (ref 11.5–15.5)
WBC: 5.9 K/uL (ref 4.0–10.5)

## 2024-10-25 LAB — T4, FREE: Free T4: 1.07 ng/dL (ref 0.60–1.60)

## 2024-10-25 LAB — T3, FREE: T3, Free: 3.9 pg/mL (ref 2.3–4.2)

## 2024-10-25 LAB — TSH: TSH: 0.54 u[IU]/mL (ref 0.35–5.50)

## 2024-10-25 LAB — VITAMIN D 25 HYDROXY (VIT D DEFICIENCY, FRACTURES): VITD: 12.99 ng/mL — ABNORMAL LOW (ref 30.00–100.00)

## 2024-10-25 NOTE — Progress Notes (Signed)
 "  History of Present Illness:   Chief Complaint  Patient presents with   Dizziness    Pt c/o dizziness/lightheadedness off and on and feeling off balance x 1 year, She would like a referral to Neurology.     Discussed the use of AI scribe software for clinical note transcription with the patient, who gave verbal consent to proceed.  History of Present Illness   Maria Sutton is a 29 year old female with lupus and rheumatoid arthritis who presents with lightheadedness and dizziness.  She has had daily lightheadedness and dizziness for 1-2 years, worse with positional changes such as bending down or getting out of bed, causing her to bump into door frames and once black out while swimming last summer during a flip in the pool to change directions.   She describes feeling top heavy and as if her brain is sliding.   She has lupus and rheumatoid arthritis treated with methotrexate  for about four years and uses Celebrex  as needed for inflammation but limits it due to gastrointestinal side effects.   She also has PCOS, PTSD with significant anxiety around needles, chronic migraines with a prior normal brain MRI in February 2022, and cannot tolerate Topamax due to severe adverse effects, instead using CBD for migraine relief.   She reports brain fog, fatigue, morning headaches, and numbness and tingling in her hands during sleep. A prior sleep study showed she is very active during sleep. Her Apple Watch recordings show nocturnal oxygen levels in the 93-95% range during active sleep, and she wakes fatigued and headachy regardless of recorded oxygen levels. Most recent hemoglobin was 15.9 with her rheumatologist.  She reports a past diagnosis of subclinical hypothyroidism that was later questioned by another clinician and notes significant weight changes, with no thyroid  imaging performed. She recently completed antibiotics for a cat bite that affected her liver and is still recovering.   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 Family history is notable for low blood pressure in her father and high blood pressure in her mother. She takes a multivitamin with folic acid  and vitamin D .        Past Medical History:  Diagnosis Date   ADHD    Anxiety    Depression    Depression    [employer] 08/07/2020   Headache    Lupus    PCOS (polycystic ovarian syndrome)    PTSD (post-traumatic stress disorder)    Rheumatoid arthritis (HCC)    Systemic lupus erythematosus (HCC)    Thyroid  disease    hypothyroid     Social History[1]  No past surgical history on file.  Family History  Problem Relation Age of Onset   Hypertension Mother    Breast cancer Other     Allergies[2]  Current Medications:  Current Medications[3]   Review of Systems:   Negative unless otherwise specified per HPI.  Vitals:   Vitals:   10/25/24 0944  BP: (!) 124/90  Pulse: 79  Temp: 97.8 F (36.6 C)  TempSrc: Temporal  SpO2: 98%  Weight: 238 lb (108 kg)  Height: 5' 7 (1.702 m)     Body mass index is 37.28 kg/m.  Physical Exam:   Physical Exam Vitals and nursing note reviewed.  Constitutional:      General: She is not in acute distress.    Appearance: She is well-developed. She is not ill-appearing or toxic-appearing.  Cardiovascular:  Rate and Rhythm: Normal rate and regular rhythm.     Pulses: Normal pulses.     Heart sounds: Normal heart sounds, S1 normal and S2 normal.  Pulmonary:     Effort: Pulmonary effort is normal.     Breath sounds: Normal breath sounds.  Skin:    General: Skin is warm and dry.  Neurological:     General: No focal deficit present.     Mental Status: She is alert.     GCS: GCS eye subscore is 4. GCS verbal subscore is 5. GCS motor subscore is 6.     Cranial Nerves: Cranial nerves 2-12 are intact.     Sensory: Sensation is intact.     Motor: Motor function is intact.      Coordination: Coordination is intact.     Gait: Gait is intact.  Psychiatric:        Speech: Speech normal.        Behavior: Behavior normal. Behavior is cooperative.     Assessment and Plan:   Assessment and Plan    Episodic lightheadedness; other fatigue Chronic symptoms exacerbated by positional changes and activity. Elevated hemoglobin may contribute. Neurology referral requested. - Referred to neurology for comprehensive evaluation - Rechecked CBC to monitor hemoglobin levels. If persistently elevated, recommend close follow up with PCP to discuss further next steps. - Encouraged home blood pressure monitoring to assess for orthostatic changes.  Vitamin D  deficiency Managed with multivitamin containing vitamin D . -Update vitamin D  level and provide recommendations   Subclinical hypothyroidism  Previous conflicting diagnoses of hypothyroidism and normal thyroid  function. Symptoms include weight gain and fatigue. - Ordered thyroid  function tests to evaluate for new onset thyroid  dysfunction.         Lucie Buttner, PA-C     [1]  Social History Tobacco Use   Smoking status: Never    Passive exposure: Never   Smokeless tobacco: Never  Vaping Use   Vaping status: Never Used  Substance Use Topics   Alcohol use: Not Currently   Drug use: Never  [2] No Known Allergies [3]  Current Outpatient Medications:    celecoxib  (CELEBREX ) 200 MG capsule, Take 1 capsule (200 mg total) by mouth 2 (two) times daily as needed., Disp: 60 capsule, Rfl: 0   folic acid  (FOLVITE ) 1 MG tablet, Take 1 tablet (1 mg total) by mouth daily., Disp: 90 tablet, Rfl: 3   methotrexate  (RHEUMATREX) 2.5 MG tablet, Take 4 tablets (10 mg total) by mouth once a week. Caution:Chemotherapy. Protect from light., Disp: , Rfl:    Multiple Vitamins-Minerals (MULTIVITAMIN WITH MINERALS) tablet, Take 1 tablet by mouth daily., Disp: , Rfl:    SLYND  4 MG TABS, TAKE ONE (1) TABLET (4 MG TOTAL) BY MOUTH  DAILY., Disp: 84 tablet, Rfl: 1  "

## 2024-10-25 NOTE — Patient Instructions (Signed)
 Please call [facility] for new patient appointment at your convenience --> [phone]  Your blood pressure is elevated in our office today.  I recommend that you monitor this at home.  Your goal blood pressure should be around < 130/80, unless you are over 28 years old, your goal may be closer to 140-150/90. Please note if you have been given other goals from a cardiologist or other healthcare provider, please defer to their recommendations.  When preparing to take your blood pressure: Plan ahead. Dont smoke, drink caffeine or exercise within 30 minutes before taking your blood pressure. Empty your bladder. Don't take the measurement over clothes. Remove the clothing over the arm that will be used to measure blood pressure. You can use either arm unless otherwise told by a healthcare provider. Usually there is not a big difference between readings on them. Be still. Allow at least five minutes of quiet rest before measurements. Dont talk or use the phone. Sit correctly. Sit with your back straight and supported (on a dining chair, rather than a sofa). Your feet should be flat on the floor. Do not cross your legs. Support your arm on a flat surface. The middle of the cuff should be placed on the upper arm at heart level.  Measure at the same time of the day. Take multiple readings and record the results. Each time you measure, take two readings one minute apart. Record the results and bring in to your next office visit.  In order to know how well the medication is working, I would like you to take your readings 1-2 hours after taking your blood pressure medication if possible. Take your blood pressure measurements and record 2-3 days per week.  If you get a high blood pressure reading: A single high reading is not an immediate cause for alarm. If you get a reading that is higher than normal, take your blood pressure a second time. Write down the results of both measurements. Check  with your health care professional to see if theres a health concern or whether there may be problems with your monitor. If your blood pressure readings are suddenly higher than 180/120 mm Hg, wait at least one minute and test again. If your readings are still very high, contact your health care professional immediately. You could be having a hypertensive crisis. Call 911 if your blood pressure is higher than 180/120 mm Hg and if you are having new signs or symptoms that may include: Chest pain Shortness of breath Back pain Numbness Weakness Change in vision Difficulty speaking Confusion Dizziness Vomiting

## 2024-10-26 ENCOUNTER — Ambulatory Visit: Payer: Self-pay | Admitting: Physician Assistant

## 2024-10-26 MED ORDER — VITAMIN D (ERGOCALCIFEROL) 1.25 MG (50000 UNIT) PO CAPS: 50000.0000 [IU] | ORAL_CAPSULE | ORAL | 0 refills | Status: AC

## 2024-10-29 ENCOUNTER — Other Ambulatory Visit: Payer: Self-pay | Admitting: Nurse Practitioner

## 2024-10-29 DIAGNOSIS — Z3009 Encounter for other general counseling and advice on contraception: Secondary | ICD-10-CM

## 2024-10-29 DIAGNOSIS — Z01419 Encounter for gynecological examination (general) (routine) without abnormal findings: Secondary | ICD-10-CM

## 2024-10-29 DIAGNOSIS — N913 Primary oligomenorrhea: Secondary | ICD-10-CM

## 2024-10-29 DIAGNOSIS — N922 Excessive menstruation at puberty: Secondary | ICD-10-CM

## 2024-11-01 ENCOUNTER — Encounter: Admitting: Family Medicine

## 2024-11-03 ENCOUNTER — Encounter: Payer: Self-pay | Admitting: Neurology

## 2024-11-03 ENCOUNTER — Encounter: Payer: Self-pay | Admitting: Physician Assistant

## 2024-11-03 DIAGNOSIS — R42 Dizziness and giddiness: Secondary | ICD-10-CM

## 2024-11-08 ENCOUNTER — Ambulatory Visit: Payer: Self-pay | Admitting: Obstetrics and Gynecology

## 2024-11-08 ENCOUNTER — Ambulatory Visit: Admitting: Family Medicine

## 2024-11-11 ENCOUNTER — Other Ambulatory Visit: Payer: Self-pay | Admitting: *Deleted

## 2024-11-11 DIAGNOSIS — N913 Primary oligomenorrhea: Secondary | ICD-10-CM

## 2024-11-11 DIAGNOSIS — Z01419 Encounter for gynecological examination (general) (routine) without abnormal findings: Secondary | ICD-10-CM

## 2024-11-11 DIAGNOSIS — N922 Excessive menstruation at puberty: Secondary | ICD-10-CM

## 2024-11-11 DIAGNOSIS — Z3009 Encounter for other general counseling and advice on contraception: Secondary | ICD-10-CM

## 2024-11-11 MED ORDER — SLYND 4 MG PO TABS: 1.0000 | ORAL_TABLET | Freq: Every day | ORAL | 0 refills | Status: AC

## 2024-11-11 NOTE — Progress Notes (Signed)
 RX [facility]  to cover pt until annual visit. Rescheduled due to inclement weather.

## 2024-11-30 ENCOUNTER — Ambulatory Visit: Admitting: Family Medicine

## 2024-12-13 ENCOUNTER — Ambulatory Visit: Admitting: Internal Medicine

## 2025-02-10 ENCOUNTER — Ambulatory Visit: Payer: Self-pay | Admitting: Neurology
